# Patient Record
Sex: Female | Born: 1985 | Race: White | Hispanic: Yes | Marital: Married | Smoking: Never smoker
Health system: Southern US, Community
[De-identification: ages and names within clinical notes are randomized; demographics above are authoritative.]

## PROBLEM LIST (undated history)

## (undated) ENCOUNTER — Inpatient Hospital Stay (HOSPITAL_COMMUNITY): Payer: Self-pay

## (undated) DIAGNOSIS — I10 Essential (primary) hypertension: Secondary | ICD-10-CM

## (undated) DIAGNOSIS — G43829 Menstrual migraine, not intractable, without status migrainosus: Secondary | ICD-10-CM

## (undated) DIAGNOSIS — G4730 Sleep apnea, unspecified: Secondary | ICD-10-CM

## (undated) DIAGNOSIS — N912 Amenorrhea, unspecified: Secondary | ICD-10-CM

## (undated) DIAGNOSIS — L659 Nonscarring hair loss, unspecified: Secondary | ICD-10-CM

## (undated) DIAGNOSIS — E282 Polycystic ovarian syndrome: Secondary | ICD-10-CM

## (undated) HISTORY — DX: Essential (primary) hypertension: I10

## (undated) HISTORY — DX: Sleep apnea, unspecified: G47.30

## (undated) HISTORY — DX: Amenorrhea, unspecified: N91.2

## (undated) HISTORY — PX: WISDOM TOOTH EXTRACTION: SHX21

## (undated) HISTORY — DX: Nonscarring hair loss, unspecified: L65.9

## (undated) HISTORY — DX: Polycystic ovarian syndrome: E28.2

## (undated) HISTORY — DX: Menstrual migraine, not intractable, without status migrainosus: G43.829

---
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
## 2006-03-12 HISTORY — PX: FOOT SURGERY: SHX648

## 2011-06-26 ENCOUNTER — Other Ambulatory Visit: Payer: Self-pay

## 2011-06-26 MED ORDER — NORETHINDRONE ACET-ETHINYL EST 1.5-30 MG-MCG PO TABS: 1.0000 | ORAL_TABLET | Freq: Every day | ORAL | Status: DC

## 2011-07-03 ENCOUNTER — Telehealth: Payer: Self-pay

## 2011-07-03 ENCOUNTER — Other Ambulatory Visit: Payer: Self-pay

## 2011-07-03 NOTE — Telephone Encounter (Signed)
Lm with mom to [place] office rgd rx rf request from pharmacy

## 2011-07-03 NOTE — Telephone Encounter (Signed)
Spoke with pt rgd rx rf request from pharm pt states have enough packs until aex

## 2011-10-15 ENCOUNTER — Encounter: Payer: Self-pay | Admitting: Obstetrics and Gynecology

## 2011-10-15 ENCOUNTER — Ambulatory Visit (INDEPENDENT_AMBULATORY_CARE_PROVIDER_SITE_OTHER): Payer: 59 | Admitting: Obstetrics and Gynecology

## 2011-10-15 VITALS — BP 130/80 | HR 84 | Ht 64.0 in | Wt 251.0 lb

## 2011-10-15 DIAGNOSIS — Z124 Encounter for screening for malignant neoplasm of cervix: Secondary | ICD-10-CM

## 2011-10-15 MED ORDER — NORGESTREL-ETHINYL ESTRADIOL 0.3-30 MG-MCG PO TABS: 1.0000 | ORAL_TABLET | Freq: Every day | ORAL | Status: DC

## 2011-10-15 NOTE — Progress Notes (Signed)
Last Pap: 10/23/10 WNL: Yes Regular Periods:yes Contraception: Pill   Monthly Breast exam:no Tetanus<60yrs:yes Nl.Bladder Function:yes Daily BMs:yes Healthy Diet:yes Calcium:no Mammogram:no Date of Mammogram: n/a Exercise:yes Have often Exercise: sometimes Seatbelt: yes Abuse at home: no Stressful work:yes Sigmoid-colonoscopy: n/a Bone Density: No PCP: Dr. Johny Blamer Change in PMH: none Change in RUE:AVWU Pt without complaints Physical Examination: General appearance - alert, well appearing, and in no distress Mental status - normal mood, behavior, speech, dress, motor activity, and thought processes Neck - supple, no significant adenopathy, thyroid exam: thyroid is normal in size without nodules or tenderness Chest - clear to auscultation, no wheezes, rales or rhonchi, symmetric air entry Heart - normal rate and regular rhythm Abdomen - soft, nontender, nondistended, no masses or organomegaly Breasts - breasts appear normal, no suspicious masses, no skin or nipple changes or axillary nodes Pelvic - normal external genitalia, vulva, vagina, cervix, uterus and adnexa Rectal - normal rectal, no masses, rectal exam not indicated Back exam - full range of motion, no tenderness, palpable spasm or pain on motion Neurological - alert, oriented, normal speech, no focal findings or movement disorder noted Musculoskeletal - no joint tenderness, deformity or swelling Extremities - no edema, redness or tenderness in the calves or thighs Skin - normal coloration and turgor, no rashes, no suspicious skin lesions noted Routine exam Pap sent yes next due 2015 Mammogram due no lo ovral rx given RT 39yr

## 2011-10-15 NOTE — Patient Instructions (Signed)
Exercise to Stay Healthy  Exercise helps you become and stay healthy.    EXERCISE IDEAS AND TIPS  Choose exercises that:   You enjoy.   Fit into your day.  You do not need to exercise really hard to be healthy. You can do exercises at a slow or medium level and stay healthy. You can:   Stretch before and after working out.   Try yoga, Pilates, or tai chi.   Lift weights.   Walk fast, swim, jog, run, climb stairs, bicycle, dance, or rollerskate.   Take aerobic classes.    Exercises that burn about 150 calories:     Running 1  miles in 15 minutes.   Playing volleyball for 45 to 60 minutes.   Washing and waxing a car for 45 to 60 minutes.   Playing touch football for 45 minutes.   Walking 1  miles in 35 minutes.   Pushing a stroller 1  miles in 30 minutes.   Playing basketball for 30 minutes.   Raking leaves for 30 minutes.   Bicycling 5 miles in 30 minutes.   Walking 2 miles in 30 minutes.   Dancing for 30 minutes.   Shoveling snow for 15 minutes.   Swimming laps for 20 minutes.   Walking up stairs for 15 minutes.   Bicycling 4 miles in 15 minutes.   Gardening for 30 to 45 minutes.   Jumping rope for 15 minutes.   Washing windows or floors for 45 to 60 minutes.  Document Released: 03/31/2010 Document Revised: 02/15/2011 Document Reviewed: 03/31/2010  [employer] Patient Information 2012 [employer].

## 2011-10-16 LAB — PAP IG W/ RFLX HPV ASCU

## 2015-06-23 ENCOUNTER — Other Ambulatory Visit: Payer: Self-pay | Admitting: Obstetrics and Gynecology

## 2015-06-23 DIAGNOSIS — R922 Inconclusive mammogram: Secondary | ICD-10-CM

## 2015-06-27 ENCOUNTER — Ambulatory Visit
Admission: RE | Admit: 2015-06-27 | Discharge: 2015-06-27 | Disposition: A | Discharge status: Home / Self Care | Payer: PRIVATE HEALTH INSURANCE | Source: Ambulatory Visit | Attending: Obstetrics and Gynecology | Admitting: Obstetrics and Gynecology

## 2015-06-27 DIAGNOSIS — R922 Inconclusive mammogram: Secondary | ICD-10-CM

## 2019-01-09 DIAGNOSIS — F419 Anxiety disorder, unspecified: Secondary | ICD-10-CM | POA: Insufficient documentation

## 2019-11-01 DIAGNOSIS — G4733 Obstructive sleep apnea (adult) (pediatric): Secondary | ICD-10-CM | POA: Insufficient documentation

## 2019-11-26 DIAGNOSIS — K7581 Nonalcoholic steatohepatitis (NASH): Secondary | ICD-10-CM | POA: Insufficient documentation

## 2019-11-27 DIAGNOSIS — E7841 Elevated Lipoprotein(a): Secondary | ICD-10-CM | POA: Insufficient documentation

## 2020-02-01 LAB — OB RESULTS CONSOLE ABO/RH: RH Type: POSITIVE

## 2020-03-12 NOTE — L&D Delivery Note (Addendum)
Delivery Note:   G1P0 at [redacted]w[redacted]d  Admitting diagnosis: Preterm labor Risks:  1. Mo/Di twins, concordant growth, AGA, no TTTS 2. Cervical incompetence since 23 wks, on vaginal prometrium, BMZ course given at 24 wks and rescue dose at 34 wks 3. GDM A1 well controlled 4. BMI 48 with adequate TWG 5. Hx PCOS 6. Hx infertility 7. GBS positive 8. Labile BP  Onset of labor: 4/19 @ 10 pm IOL/Augmentation: AROM and Pitocin ROM: Twin A 0955 4/20 clear fluid  Complete dilation at 1108 Onset of pushing at 1110 for 1.5 hrs, patient unable to tolerate pelvic discomfort with pushing, epidural and passive descent recommended and agreed. Pitocin started after epidural for augmentation.   FHR second stage Twin A and B cat I  Analgesia /Anesthesia intrapartum: epidural Pushing in H/K then recumbent with R and L tilt position with SNM, CNM and L&D staff support, mother [name] and FOB [name] present for birth and supportive. Pushing restarted at 1530 after passive descent and frequent position changes, patient able to push more effectively with epidural relief.  Delivery of a    [place], [place] [[id number]]  Live born female  Birth Weight:  pending APGAR: 7, 8  Newborn Delivery   Birth date/time: 06/29/2019 17:02:00 Delivery type: Vaginal, Spontaneous    [facility]born delivered [facility],  OA to ROT, easy shoulders after CAN x 1 reduced. Baby with stunned appearance, briefly shown to mother than cord clamped and cut and baby passed to NICU team in room.   Dr. Ernestina Penna present for birth and assisted with BS sono for presentation, Twin B vertex with fetal hand presenting compound. Encouraged hand to retract by gently pinching and momentarily resolved, AROM with next contraction, then loop of cord presented with large gush of fluid.  Patient encouraged to push and brought vertex to +3 station with several pushed then vacuum applied by Dr. Ernestina Penna and expedited delivery of Twin B. Baby  vigorous and strong cry with stimulation. To mother's chest shortly then to NICU team for assessment. Cord cut by Dr. Ernestina Penna after delivery.    [address] [place], [place] [[id number]]  Live born female  Birth Weight:  pending APGAR: 8, 9  Newborn Delivery   Birth date/time: 06/[birth year]/2020 17:07:00 Delivery type: Vaginal, Vacuum (Extractor)    Care resumed by [facility] and attending CNM. Collection of cord blood for typing completed. Cord blood donation-no   Placenta delivered- S/C/I . Of note, Twin A marginal cord insertion, twin B velamentous insertion and true knot in cord.  Uterotonics: Pitocin IV bolus Placenta to L&D for disposal. Uterine tone firm, bleeding small  First degree perineal lac noted, small bleed, approximated with 2 running suture, good hemostasis.  Local analgesia: NA  Repair: 3.0 Vicryl Est. Blood Loss (mL): 75 cc   Complications: None   Mom to postpartum.  Babies [name] and [place] to NICU.  Delivery Report:  Review the Delivery Report for details.     Signed: Neta Mends, CNM, MSN 06/29/2020, 5:51 PM  Delivery as described above.  I was present for the entire delivery.  After delivery of baby A performed a bedside ultrasound to confirm position of baby B.  Vertex confirmed and after resolution of nuchal hand AROM done by Arlan Organ.  At this time cord prolapse of baby B was noted.  And mother encouraged to push.  Patient usually pushed baby to +3 station.  Given good pushing effort and small baby decision made to deliver through cord prolapse  with assist of a Kiwi vacuum.  Gestational age appreciated.  Risk benefits favored expedited delivery.  NICU in attendance.  Vacuum was placed on the fetal occiput but rapid pop-off due to excessive hair and poor suction.  Second attempt at the vacuum achieved good suction and with gentle downward pressure over about 20 seconds baby vertex delivered easily.  Vacuum removed remainder of baby delivered without delay or  complication.  Lendon Colonel 06/30/2020 11:13 AM

## 2020-03-17 ENCOUNTER — Telehealth: Payer: Self-pay | Admitting: Obstetrics and Gynecology

## 2020-03-17 NOTE — Telephone Encounter (Signed)
I TALK WITH PATIENT SHE IS AWARE OF APPT AND VISITOR POLICY

## 2020-03-29 ENCOUNTER — Encounter: Payer: Self-pay | Admitting: *Deleted

## 2020-03-30 ENCOUNTER — Other Ambulatory Visit: Payer: Self-pay | Admitting: *Deleted

## 2020-03-30 ENCOUNTER — Other Ambulatory Visit: Payer: Self-pay

## 2020-03-30 ENCOUNTER — Encounter: Payer: Self-pay | Admitting: *Deleted

## 2020-03-30 ENCOUNTER — Ambulatory Visit: Payer: No Typology Code available for payment source | Admitting: *Deleted

## 2020-03-30 ENCOUNTER — Other Ambulatory Visit: Payer: Self-pay | Admitting: Obstetrics and Gynecology

## 2020-03-30 ENCOUNTER — Ambulatory Visit: Payer: No Typology Code available for payment source | Attending: Obstetrics and Gynecology

## 2020-03-30 VITALS — BP 123/72 | HR 95

## 2020-03-30 DIAGNOSIS — Z363 Encounter for antenatal screening for malformations: Secondary | ICD-10-CM

## 2020-03-30 DIAGNOSIS — O30002 Twin pregnancy, unspecified number of placenta and unspecified number of amniotic sacs, second trimester: Secondary | ICD-10-CM

## 2020-03-30 DIAGNOSIS — E669 Obesity, unspecified: Secondary | ICD-10-CM | POA: Diagnosis not present

## 2020-03-30 DIAGNOSIS — O10912 Unspecified pre-existing hypertension complicating pregnancy, second trimester: Secondary | ICD-10-CM | POA: Diagnosis not present

## 2020-03-30 DIAGNOSIS — O30032 Twin pregnancy, monochorionic/diamniotic, second trimester: Secondary | ICD-10-CM | POA: Diagnosis not present

## 2020-03-30 DIAGNOSIS — O10019 Pre-existing essential hypertension complicating pregnancy, unspecified trimester: Secondary | ICD-10-CM | POA: Insufficient documentation

## 2020-03-30 DIAGNOSIS — Z3A21 21 weeks gestation of pregnancy: Secondary | ICD-10-CM

## 2020-03-30 DIAGNOSIS — Z8489 Family history of other specified conditions: Secondary | ICD-10-CM

## 2020-03-30 DIAGNOSIS — O321XX2 Maternal care for breech presentation, fetus 2: Secondary | ICD-10-CM

## 2020-03-30 DIAGNOSIS — O30039 Twin pregnancy, monochorionic/diamniotic, unspecified trimester: Secondary | ICD-10-CM

## 2020-03-30 DIAGNOSIS — O99212 Obesity complicating pregnancy, second trimester: Secondary | ICD-10-CM

## 2020-03-30 DIAGNOSIS — Z3686 Encounter for antenatal screening for cervical length: Secondary | ICD-10-CM

## 2020-04-11 ENCOUNTER — Inpatient Hospital Stay (HOSPITAL_COMMUNITY)
Admission: AD | Admit: 2020-04-11 | Discharge: 2020-04-12 | Disposition: A | Discharge status: Home / Self Care | Payer: No Typology Code available for payment source | Attending: Obstetrics & Gynecology | Admitting: Obstetrics & Gynecology

## 2020-04-11 ENCOUNTER — Other Ambulatory Visit: Payer: Self-pay

## 2020-04-11 DIAGNOSIS — R102 Pelvic and perineal pain unspecified side: Secondary | ICD-10-CM

## 2020-04-11 DIAGNOSIS — R012 Other cardiac sounds: Secondary | ICD-10-CM | POA: Insufficient documentation

## 2020-04-11 DIAGNOSIS — O26892 Other specified pregnancy related conditions, second trimester: Secondary | ICD-10-CM | POA: Insufficient documentation

## 2020-04-11 DIAGNOSIS — Z3492 Encounter for supervision of normal pregnancy, unspecified, second trimester: Secondary | ICD-10-CM

## 2020-04-11 DIAGNOSIS — Z3A23 23 weeks gestation of pregnancy: Secondary | ICD-10-CM | POA: Insufficient documentation

## 2020-04-11 DIAGNOSIS — O26899 Other specified pregnancy related conditions, unspecified trimester: Secondary | ICD-10-CM

## 2020-04-12 ENCOUNTER — Encounter (HOSPITAL_COMMUNITY): Payer: Self-pay | Admitting: Obstetrics & Gynecology

## 2020-04-12 DIAGNOSIS — Z3A23 23 weeks gestation of pregnancy: Secondary | ICD-10-CM

## 2020-04-12 DIAGNOSIS — R102 Pelvic and perineal pain: Secondary | ICD-10-CM

## 2020-04-12 DIAGNOSIS — R012 Other cardiac sounds: Secondary | ICD-10-CM | POA: Diagnosis present

## 2020-04-12 DIAGNOSIS — O26892 Other specified pregnancy related conditions, second trimester: Secondary | ICD-10-CM | POA: Diagnosis not present

## 2020-04-12 NOTE — Discharge Instructions (Signed)
Preterm Labor The normal length of a pregnancy is 39-41 weeks. Preterm labor is when labor starts before 37 completed weeks of pregnancy. Babies who are born prematurely and survive may not be fully developed and may be at an increased risk for long-term problems such as cerebral palsy, developmental delays, and vision and hearing problems. Babies who are born too early may have problems soon after birth. Problems may include regulating blood sugar, body temperature, heart rate, and breathing rate. These babies often have trouble with feeding. The risk of having problems is highest for babies who are born before 34 weeks of pregnancy. What are the causes? The exact cause of this condition is not known. What increases the risk? You are more likely to have preterm labor if you have certain risk factors that relate to your medical history, problems with present and past pregnancies, and lifestyle factors. Medical history  You have abnormalities of the uterus, including a short cervix.  You have STIs (sexually transmitted infections), or other infections of the urinary tract and the vagina.  You have chronic illnesses, such as blood clotting problems, diabetes, or high blood pressure.  You are overweight or underweight. Present and past pregnancies  You have had preterm labor before.  You are pregnant with twins or other multiples.  You have been diagnosed with a condition in which the placenta covers your cervix (placenta previa).  You waited less than 6 months between giving birth and becoming pregnant again.  Your unborn baby has some abnormalities.  You have vaginal bleeding during pregnancy.  You became pregnant through in vitro fertilization (IVF). Lifestyle and environmental factors  You use tobacco products.  You drink alcohol.  You use street drugs.  You have stress and no social support.  You experience domestic violence.  You are exposed to certain chemicals or  environmental pollutants. Other factors  You are younger than age 17 or older than age 35. What are the signs or symptoms? Symptoms of this condition include:  Cramps similar to those that can happen during a menstrual period. The cramps may happen with diarrhea.  Pain in the abdomen or lower back.  Regular contractions that may feel like tightening of the abdomen.  A feeling of increased pressure in the pelvis.  Increased watery or bloody mucus discharge from the vagina.  Water breaking (ruptured amniotic sac). How is this diagnosed? This condition is diagnosed based on:  Your medical history and a physical exam.  A pelvic exam.  An ultrasound.  Monitoring your uterus for contractions.  Other tests, including: ? A swab of the cervix to check for a chemical called fetal fibronectin. ? Urine tests. How is this treated? Treatment for this condition depends on the length of your pregnancy, your condition, and the health of your baby. Treatment may include:  Taking medicines, such as: ? Hormone medicines. These may be given early in pregnancy to help support the pregnancy. ? Medicines to stop contractions. ? Medicines to help mature the baby's lungs. These may be prescribed if the risk of delivery is high. ? Medicines to prevent your baby from developing cerebral palsy.  Bed rest. If the labor happens before 34 weeks of pregnancy, you may need to stay in the hospital.  Delivery of the baby. Follow these instructions at home:  Do not use any products that contain nicotine or tobacco, such as cigarettes, e-cigarettes, and chewing tobacco. If you need help quitting, ask your health care provider.  Do not drink alcohol.    Take over-the-counter and prescription medicines only as told by your health care provider.  Rest as told by your health care provider.  Return to your normal activities as told by your health care provider. Ask your health care provider what activities  are safe for you.  Keep all follow-up visits as told by your health care provider. This is important.   How is this prevented? To increase your chance of having a full-term pregnancy:  Do not use street drugs or medicines that have not been prescribed to you during your pregnancy.  Talk with your health care provider before taking any herbal supplements, even if you have been taking them regularly.  Make sure you gain a healthy amount of weight during your pregnancy.  Watch for infection. If you think that you might have an infection, get it checked right away. Symptoms of infection may include: ? Fever. ? Abnormal vaginal discharge or discharge that smells bad. ? Pain or burning with urination. ? Needing to urinate urgently. ? Frequently urinating or passing small amounts of urine frequently. ? Blood in your urine. ? Urine that smells bad or unusual.  Tell your health care provider if you have had preterm labor before. Contact a health care provider if:  You think you are going into preterm labor.  You have signs or symptoms of preterm labor.  You have symptoms of infection. Get help right away if:  You are having regular, painful contractions every 5 minutes or less.  Your water breaks. Summary  Preterm labor is labor that starts before you reach 37 weeks of pregnancy.  Delivering your baby early increases your baby's risk of developing lifelong problems.  The exact cause of preterm labor is unknown. However, having an abnormal uterus, an STI (sexually transmitted infection), or vaginal bleeding during pregnancy increases your risk for preterm labor.  Keep all follow-up visits as told by your health care provider. This is important.  Contact a health care provider if you have signs or symptoms of preterm labor. This information is not intended to replace advice given to you by your health care provider. Make sure you discuss any questions you have with your health care  provider. Document Revised: 03/31/2019 Document Reviewed: 03/31/2019 Elsevier Patient Education  2021 [employer].  

## 2020-04-12 NOTE — MAU Provider Note (Addendum)
Event Date/Time   First Provider Initiated Contact with Patient 04/12/20 0238     S Ms. Ashley Durham is a 35 y.o. G1P0 pregnant female at [redacted]w[redacted]d who presents to MAU today with complaint of pelvic cramping since bedtime (2300). She receives care at [facility] (with Dr. Billy Coast) and is being followed for a shortened/tunneling cervix. Today her TVUS showed a CL=0.98 but closed on cervical exam. She was prescribed vaginal progesterone but did not place it this evening since the bottle said "take orally." Denies vaginal bleeding or contractions, endorses fetal movement.   O BP 128/81   Pulse 97   Temp 98.5 F (36.9 C) (Oral)   Resp 18   Ht 5\' 4"  (1.626 m)   Wt 288 lb 4.8 oz (130.8 kg)   LMP 10/10/2019   SpO2 99%   BMI 49.49 kg/m  Physical Exam Vitals and nursing note reviewed. Exam conducted with a chaperone present.  Constitutional:      General: She is not in acute distress.    Appearance: Normal appearance. She is not ill-appearing.  HENT:     Mouth/Throat:     Mouth: Mucous membranes are moist.  Eyes:     Pupils: Pupils are equal, round, and reactive to light.  Cardiovascular:     Rate and Rhythm: Normal rate.     Pulses: Normal pulses.  Pulmonary:     Effort: Pulmonary effort is normal.  Genitourinary:    General: Normal vulva.     Comments: Dilation: Closed Cervical Position: Middle Station: Ballotable Presentation: Undeterminable Exam by:: [email or web address], CNM   Musculoskeletal:        General: Normal range of motion.  Skin:    General: Skin is warm.     Capillary Refill: Capillary refill takes less than 2 seconds.  Neurological:     Mental Status: She is alert and oriented to person, place, and time.  Psychiatric:        Mood and Affect: Mood normal.        Behavior: Behavior normal.        Thought Content: Thought content normal.        Judgment: Judgment normal.   Babies difficult to keep on monitor due to gestational age and maternal habitus, but both  easily heard via Doppler Baby A FHR: 148 Baby B FHR: 135  Called Dr. Tyler Aas to consult regarding need for repeat TVUS for CL. Recommended repeat U/S but also recommended calling community OB to verify. Called Dr. Vergie Living who declined repeat TVUS in MAU given that cervical exam is unchanged from today. Pt has close follow up on Thursday and he will repeat if needed then and begin BMZ at that visit.  Discussed with pt and advised she can place her vaginal progesterone when she gets home as the oral and vaginal preparations are the same.   A Pelvic cramping Fetal heart tones present  P Discharge from MAU in stable condition with preterm labor precautions Follow up visit at [facility] on 04/14/20   06/12/20, CNM 04/12/2020 3:08 AM

## 2020-04-12 NOTE — MAU Note (Signed)
Patient reports to MAU after going to the doctors office today and finding out that cervix was 1cm long, not dilated, and was put on modified bedrest. Cramping started about 2300 on 04/11/20 that felt like period cramps. Called after hours line and spoke to midwife. Was recommended to come to MAU.  Denies vaginal bleeding.  +FM but possibly decreased tonight.

## 2020-04-14 ENCOUNTER — Encounter: Payer: Self-pay | Admitting: Pediatric Cardiology

## 2020-04-14 LAB — OB RESULTS CONSOLE GBS: GBS: POSITIVE

## 2020-05-05 ENCOUNTER — Other Ambulatory Visit: Payer: Self-pay

## 2020-05-05 ENCOUNTER — Ambulatory Visit: Payer: No Typology Code available for payment source | Admitting: *Deleted

## 2020-05-05 ENCOUNTER — Other Ambulatory Visit: Payer: Self-pay | Admitting: *Deleted

## 2020-05-05 ENCOUNTER — Ambulatory Visit: Payer: No Typology Code available for payment source | Attending: Obstetrics and Gynecology

## 2020-05-05 ENCOUNTER — Encounter: Payer: Self-pay | Admitting: *Deleted

## 2020-05-05 VITALS — BP 126/77 | HR 100

## 2020-05-05 DIAGNOSIS — Z3686 Encounter for antenatal screening for cervical length: Secondary | ICD-10-CM | POA: Diagnosis not present

## 2020-05-05 DIAGNOSIS — Z363 Encounter for antenatal screening for malformations: Secondary | ICD-10-CM

## 2020-05-05 DIAGNOSIS — O30032 Twin pregnancy, monochorionic/diamniotic, second trimester: Secondary | ICD-10-CM | POA: Diagnosis not present

## 2020-05-05 DIAGNOSIS — O10912 Unspecified pre-existing hypertension complicating pregnancy, second trimester: Secondary | ICD-10-CM | POA: Insufficient documentation

## 2020-05-05 DIAGNOSIS — Z3A27 27 weeks gestation of pregnancy: Secondary | ICD-10-CM

## 2020-05-05 DIAGNOSIS — O10012 Pre-existing essential hypertension complicating pregnancy, second trimester: Secondary | ICD-10-CM

## 2020-05-05 DIAGNOSIS — O30039 Twin pregnancy, monochorionic/diamniotic, unspecified trimester: Secondary | ICD-10-CM

## 2020-05-05 DIAGNOSIS — O99212 Obesity complicating pregnancy, second trimester: Secondary | ICD-10-CM | POA: Diagnosis not present

## 2020-05-05 DIAGNOSIS — E669 Obesity, unspecified: Secondary | ICD-10-CM

## 2020-05-05 DIAGNOSIS — Z8489 Family history of other specified conditions: Secondary | ICD-10-CM

## 2020-05-12 LAB — OB RESULTS CONSOLE RPR
RPR: NONREACTIVE
RPR: NONREACTIVE
RPR: NONREACTIVE

## 2020-05-19 ENCOUNTER — Other Ambulatory Visit: Payer: Self-pay

## 2020-05-19 ENCOUNTER — Encounter (HOSPITAL_COMMUNITY): Payer: Self-pay | Admitting: Obstetrics and Gynecology

## 2020-05-19 ENCOUNTER — Inpatient Hospital Stay (HOSPITAL_COMMUNITY)
Admission: AD | Admit: 2020-05-19 | Discharge: 2020-05-20 | DRG: 833 | Disposition: A | Discharge status: Home / Self Care | Payer: No Typology Code available for payment source | Attending: Obstetrics and Gynecology | Admitting: Obstetrics and Gynecology

## 2020-05-19 DIAGNOSIS — O30033 Twin pregnancy, monochorionic/diamniotic, third trimester: Secondary | ICD-10-CM | POA: Diagnosis present

## 2020-05-19 DIAGNOSIS — Z8751 Personal history of pre-term labor: Secondary | ICD-10-CM | POA: Diagnosis present

## 2020-05-19 DIAGNOSIS — O24410 Gestational diabetes mellitus in pregnancy, diet controlled: Secondary | ICD-10-CM | POA: Diagnosis present

## 2020-05-19 DIAGNOSIS — Z3A31 31 weeks gestation of pregnancy: Secondary | ICD-10-CM | POA: Diagnosis not present

## 2020-05-19 DIAGNOSIS — O99820 Streptococcus B carrier state complicating pregnancy: Secondary | ICD-10-CM | POA: Diagnosis present

## 2020-05-19 DIAGNOSIS — Z20822 Contact with and (suspected) exposure to covid-19: Secondary | ICD-10-CM | POA: Diagnosis present

## 2020-05-19 LAB — DIFFERENTIAL
Abs Immature Granulocytes: 0.12 10*3/uL — ABNORMAL HIGH (ref 0.00–0.07)
Basophils Absolute: 0 10*3/uL (ref 0.0–0.1)
Basophils Relative: 0 %
Eosinophils Absolute: 0.1 10*3/uL (ref 0.0–0.5)
Eosinophils Relative: 1 %
Immature Granulocytes: 1 %
Lymphocytes Relative: 19 %
Lymphs Abs: 2 10*3/uL (ref 0.7–4.0)
Monocytes Absolute: 0.6 10*3/uL (ref 0.1–1.0)
Monocytes Relative: 6 %
Neutro Abs: 7.7 10*3/uL (ref 1.7–7.7)
Neutrophils Relative %: 73 %

## 2020-05-19 LAB — CBC
HCT: 34.7 % — ABNORMAL LOW (ref 36.0–46.0)
Hemoglobin: 11.3 g/dL — ABNORMAL LOW (ref 12.0–15.0)
MCH: 24.8 pg — ABNORMAL LOW (ref 26.0–34.0)
MCHC: 32.6 g/dL (ref 30.0–36.0)
MCV: 76.3 fL — ABNORMAL LOW (ref 80.0–100.0)
Platelets: 248 10*3/uL (ref 150–400)
RBC: 4.55 MIL/uL (ref 3.87–5.11)
RDW: 14.3 % (ref 11.5–15.5)
WBC: 10.6 10*3/uL — ABNORMAL HIGH (ref 4.0–10.5)
nRBC: 0 % (ref 0.0–0.2)

## 2020-05-19 LAB — COMPREHENSIVE METABOLIC PANEL
ALT: 27 U/L (ref 0–44)
AST: 17 U/L (ref 15–41)
Albumin: 2.7 g/dL — ABNORMAL LOW (ref 3.5–5.0)
Alkaline Phosphatase: 181 U/L — ABNORMAL HIGH (ref 38–126)
Anion gap: 9 (ref 5–15)
BUN: 7 mg/dL (ref 6–20)
CO2: 23 mmol/L (ref 22–32)
Calcium: 9.1 mg/dL (ref 8.9–10.3)
Chloride: 104 mmol/L (ref 98–111)
Creatinine, Ser: 0.54 mg/dL (ref 0.44–1.00)
GFR, Estimated: >60 mL/min (ref >60)
Glucose, Bld: 81 mg/dL (ref 70–99)
Potassium: 3.3 mmol/L — ABNORMAL LOW (ref 3.5–5.1)
Sodium: 136 mmol/L (ref 135–145)
Total Bilirubin: 0.8 mg/dL (ref 0.3–1.2)
Total Protein: 6.3 g/dL — ABNORMAL LOW (ref 6.5–8.1)

## 2020-05-19 LAB — SARS CORONAVIRUS 2 (TAT 6-24 HRS): SARS Coronavirus 2: NEGATIVE

## 2020-05-19 LAB — GLUCOSE, CAPILLARY
Glucose-Capillary: 90 mg/dL (ref 70–99)
Glucose-Capillary: 95 mg/dL (ref 70–99)

## 2020-05-19 LAB — HEMOGLOBIN A1C
Hgb A1c MFr Bld: 5.6 % (ref 4.8–5.6)
Mean Plasma Glucose: 114.02 mg/dL

## 2020-05-19 LAB — TYPE AND SCREEN
ABO/RH(D): O POS
Antibody Screen: NEGATIVE

## 2020-05-19 MED ORDER — SODIUM CHLORIDE 0.9% FLUSH
3.0000 mL | Freq: Two times a day (BID) | INTRAVENOUS | Status: DC
  Administered 2020-05-20: 3 mL via INTRAVENOUS

## 2020-05-19 MED ORDER — PRENATAL MULTIVITAMIN CH: 1.0000 | ORAL_TABLET | Freq: Every day | ORAL | Status: DC

## 2020-05-19 MED ORDER — PROGESTERONE 200 MG PO CAPS
200.0000 mg | ORAL_CAPSULE | Freq: Every day | ORAL | Status: DC
  Administered 2020-05-19: 200 mg via VAGINAL
  Filled 2020-05-19 (×3): qty 1

## 2020-05-19 MED ORDER — ACETAMINOPHEN 325 MG PO TABS: 650.0000 mg | ORAL_TABLET | ORAL | Status: DC | PRN

## 2020-05-19 MED ORDER — AZITHROMYCIN 250 MG PO TABS
1000.0000 mg | ORAL_TABLET | Freq: Once | ORAL | Status: AC
  Administered 2020-05-19: 1000 mg via ORAL
  Filled 2020-05-19: qty 4

## 2020-05-19 MED ORDER — CALCIUM CARBONATE ANTACID 500 MG PO CHEW: 2.0000 | CHEWABLE_TABLET | ORAL | Status: DC | PRN

## 2020-05-19 MED ORDER — SODIUM CHLORIDE 0.9% FLUSH: 3.0000 mL | INTRAVENOUS | Status: DC | PRN

## 2020-05-19 MED ORDER — DOCUSATE SODIUM 100 MG PO CAPS: 100.0000 mg | ORAL_CAPSULE | Freq: Every day | ORAL | Status: DC

## 2020-05-19 MED ORDER — AMOXICILLIN 500 MG PO CAPS: 500.0000 mg | ORAL_CAPSULE | Freq: Three times a day (TID) | ORAL | Status: DC

## 2020-05-19 MED ORDER — ZOLPIDEM TARTRATE 5 MG PO TABS
5.0000 mg | ORAL_TABLET | Freq: Every evening | ORAL | Status: DC | PRN
Start: 2020-05-19 — End: 2020-05-20

## 2020-05-19 MED ORDER — SODIUM CHLORIDE 0.9 % IV SOLN
2.0000 g | Freq: Four times a day (QID) | INTRAVENOUS | Status: DC
  Administered 2020-05-19 – 2020-05-20 (×4): 2 g via INTRAVENOUS
  Filled 2020-05-19 (×4): qty 2000

## 2020-05-19 MED ORDER — SODIUM CHLORIDE 0.9 % IV SOLN: 250.0000 mL | INTRAVENOUS | Status: DC | PRN

## 2020-05-19 NOTE — Plan of Care (Signed)
  Problem: Education: Goal: Knowledge of disease or condition will improve Outcome: Progressing Goal: Knowledge of the prescribed therapeutic regimen will improve Outcome: Progressing   Problem: Pain Managment: Goal: General experience of comfort will improve Outcome: Progressing

## 2020-05-19 NOTE — H&P (Signed)
Ashley Durham is a 35 y.o. female presenting for preterm cervical change.Mono Di twins with concordant growth and no evidence of TTTS. Growth with MFM 2/24. History of cervical shortening this pregnancy on prometrium. Change in VE today. BMZ complete. Admit to rule out PTL. OB History    Gravida  1   Para      Term      Preterm      AB      Living  0     SAB      IAB      Ectopic      Multiple      Live Births             Past Medical History:  Diagnosis Date  . Alopecia   . Amenorrhea   . Hypertension   . Menstrual headache   . PCOS (polycystic ovarian syndrome)   . Sleep apnea    Past Surgical History:  Procedure Laterality Date  . FOOT SURGERY  2008   extra bone removed from foot  . WISDOM TOOTH EXTRACTION     Family History: family history includes Arthritis in her mother; Cancer in her father, maternal grandfather, mother, and paternal grandfather; Diabetes in her paternal grandmother; Early death in her father; Heart disease in her paternal grandmother; Miscarriages / Stillbirths in her mother; Obesity in her father, mother, paternal grandfather, and paternal grandmother; Stroke in her maternal grandfather. Social History:  reports that she has never smoked. She has never used smokeless tobacco. She reports previous alcohol use. She reports that she does not use drugs.     Maternal Diabetes: Yes:  Diabetes Type:  Diet controlled Genetic Screening: Normal Maternal Ultrasounds/Referrals: Normal Fetal Ultrasounds or other Referrals:  Referred to Materal Fetal Medicine  Maternal Substance Abuse:  No Significant Maternal Medications:  None Significant Maternal Lab Results:  Group B Strep positive Other Comments:  None  Review of Systems  Constitutional: Negative.   All other systems reviewed and are negative.  Maternal Medical History:  Fetal activity: Perceived fetal activity is normal.   Last perceived fetal movement was within the past hour.     Prenatal complications: Preterm labor.   Prenatal Complications - Diabetes: gestational.      Blood pressure (!) 147/87, pulse 93, resp. rate 16, height 5\' 4"  (1.626 m), weight 129.7 kg, last menstrual period 10/10/2019. Maternal Exam:  Uterine Assessment: Contraction strength is mild.  Contraction frequency is rare.   Abdomen: Patient reports no abdominal tenderness. Fetal presentation: vertex  Introitus: Normal vulva. Normal vagina.  Ferning test: not done.  Nitrazine test: not done.  Pelvis: adequate for delivery.   Cervix: Cervix evaluated by digital exam.     Physical Exam Constitutional:      Appearance: Normal appearance. She is obese.  HENT:     Head: Normocephalic and atraumatic.  Cardiovascular:     Rate and Rhythm: Normal rate and regular rhythm.     Pulses: Normal pulses.     Heart sounds: Normal heart sounds.  Pulmonary:     Effort: Pulmonary effort is normal.     Breath sounds: Normal breath sounds.  Abdominal:     General: Abdomen is flat. Bowel sounds are normal.     Palpations: Abdomen is soft.  Genitourinary:    General: Normal vulva.  Musculoskeletal:        General: Normal range of motion.     Cervical back: Normal range of motion and neck supple.  Skin:  General: Skin is warm and dry.  Neurological:     General: No focal deficit present.     Mental Status: She is alert and oriented to person, place, and time.  Psychiatric:        Mood and Affect: Mood normal.        Behavior: Behavior normal.     Prenatal labs: ABO, Rh:  pos Antibody:  neg Rubella:  imm RPR:   neg HBsAg:   meg HIV:   neg GBS:   pos  Assessment/Plan: 29 wk Mono C Di A twin IUP Preterm cervical change. New onset GDM GBS POS- will do IV abx for exposed membranes EFM/TOCO Prometrium If PTL noted, Mag neuroprotection Had BMZ 24wks.     Nisaiah Bechtol J 05/19/2020, 1:09 PM

## 2020-05-19 NOTE — Plan of Care (Signed)
  Problem: Education: Goal: Knowledge of General Education information will improve Description: Including pain rating scale, medication(s)/side effects and non-pharmacologic comfort measures Outcome: Completed/Met

## 2020-05-20 LAB — GLUCOSE, CAPILLARY
Glucose-Capillary: 76 mg/dL (ref 70–99)
Glucose-Capillary: 103 mg/dL — ABNORMAL HIGH (ref 70–99)

## 2020-05-20 MED ORDER — CEPHALEXIN 500 MG PO CAPS: 500.0000 mg | ORAL_CAPSULE | Freq: Two times a day (BID) | ORAL | 0 refills | Status: AC

## 2020-05-20 NOTE — Plan of Care (Signed)
  Problem: Education: Goal: Knowledge of disease or condition will improve Outcome: Adequate for Discharge Goal: Knowledge of the prescribed therapeutic regimen will improve Outcome: Adequate for Discharge Goal: Individualized Educational Video(s) Outcome: Adequate for Discharge   Problem: Clinical Measurements: Goal: Complications related to the disease process, condition or treatment will be avoided or minimized Outcome: Adequate for Discharge   Problem: Health Behavior/Discharge Planning: Goal: Ability to manage health-related needs will improve Outcome: Adequate for Discharge   Problem: Clinical Measurements: Goal: Ability to maintain clinical measurements within normal limits will improve Outcome: Adequate for Discharge Goal: Will remain free from infection Outcome: Adequate for Discharge Goal: Diagnostic test results will improve Outcome: Adequate for Discharge Goal: Respiratory complications will improve Outcome: Adequate for Discharge Goal: Cardiovascular complication will be avoided Outcome: Adequate for Discharge   Problem: Activity: Goal: Risk for activity intolerance will decrease Outcome: Adequate for Discharge   Problem: Nutrition: Goal: Adequate nutrition will be maintained Outcome: Adequate for Discharge   Problem: Coping: Goal: Level of anxiety will decrease Outcome: Adequate for Discharge   Problem: Elimination: Goal: Will not experience complications related to bowel motility Outcome: Adequate for Discharge Goal: Will not experience complications related to urinary retention Outcome: Adequate for Discharge   Problem: Pain Managment: Goal: General experience of comfort will improve Outcome: Adequate for Discharge   Problem: Safety: Goal: Ability to remain free from injury will improve Outcome: Adequate for Discharge   Problem: Skin Integrity: Goal: Risk for impaired skin integrity will decrease Outcome: Adequate for Discharge   

## 2020-05-20 NOTE — Progress Notes (Signed)
Patient ID: Ashley Durham, female   DOB: Jun 16, 1985, 35 y.o.   MRN: [id number] HD 1 No complaints No bleeding or LOF. Good FM No change in dc. BP 140/74 (BP Location: Left Arm)    Pulse 92    Temp 98.2 F (36.8 C) (Oral)    Resp 18    Ht 5\' 4"  (1.626 m)    Wt 129.7 kg    LMP 10/10/2019    SpO2 100%    BMI 49.09 kg/m    NCAT Lungs: CTA CV: RRR Abd: gravid , NT VE : deferred  FHR x reassuring Toco: Occ UI and no contractions over 18hr  IMP Preterm cervical change- no evidence PTL GDM- BS stable. GBS pos DC home PEC precautions and PTL precautions.

## 2020-05-21 LAB — URINE CULTURE: Culture: NO GROWTH

## 2020-06-03 ENCOUNTER — Ambulatory Visit: Payer: No Typology Code available for payment source | Attending: Obstetrics and Gynecology

## 2020-06-03 ENCOUNTER — Encounter: Payer: Self-pay | Admitting: *Deleted

## 2020-06-03 ENCOUNTER — Other Ambulatory Visit: Payer: Self-pay

## 2020-06-03 ENCOUNTER — Ambulatory Visit: Payer: No Typology Code available for payment source | Admitting: *Deleted

## 2020-06-03 VITALS — BP 128/62 | HR 93

## 2020-06-03 DIAGNOSIS — O10013 Pre-existing essential hypertension complicating pregnancy, third trimester: Secondary | ICD-10-CM

## 2020-06-03 DIAGNOSIS — O10913 Unspecified pre-existing hypertension complicating pregnancy, third trimester: Secondary | ICD-10-CM | POA: Insufficient documentation

## 2020-06-03 DIAGNOSIS — Z3A31 31 weeks gestation of pregnancy: Secondary | ICD-10-CM

## 2020-06-03 DIAGNOSIS — Z8489 Family history of other specified conditions: Secondary | ICD-10-CM

## 2020-06-03 DIAGNOSIS — O30039 Twin pregnancy, monochorionic/diamniotic, unspecified trimester: Secondary | ICD-10-CM | POA: Insufficient documentation

## 2020-06-03 DIAGNOSIS — O99213 Obesity complicating pregnancy, third trimester: Secondary | ICD-10-CM

## 2020-06-03 DIAGNOSIS — Z363 Encounter for antenatal screening for malformations: Secondary | ICD-10-CM

## 2020-06-03 DIAGNOSIS — E669 Obesity, unspecified: Secondary | ICD-10-CM

## 2020-06-03 DIAGNOSIS — O30033 Twin pregnancy, monochorionic/diamniotic, third trimester: Secondary | ICD-10-CM | POA: Diagnosis not present

## 2020-06-06 ENCOUNTER — Other Ambulatory Visit: Payer: Self-pay | Admitting: *Deleted

## 2020-06-06 DIAGNOSIS — O30033 Twin pregnancy, monochorionic/diamniotic, third trimester: Secondary | ICD-10-CM

## 2020-06-06 NOTE — Discharge Summary (Signed)
OB Discharge Summary  Patient Name: Ashley Durham DOB: May 22, 1985 MRN: [id number]  Date of admission: 05/19/2020 Delivering provider: This patient has no babies on file.  Admitting diagnosis: Preterm labor [O60.00] Intrauterine pregnancy: [redacted]w[redacted]d     Secondary diagnosis: Patient Active Problem List   Diagnosis Date Noted  . Preterm labor 05/19/2020   Additional problems:na   Date of discharge: 06/06/2020   Discharge diagnosis: Active Problems:   Preterm labor                                                              Post partum procedures:na  Augmentation: N/A Pain control: This patient has no babies on file. Laceration:This patient has no babies on file. Episiotomy:This patient has no babies on file. Complications: None  Hospital course:  na  Physical exam  Vitals:   05/19/20 1933 05/19/20 2333 05/20/20 0418 05/20/20 0756  BP: (!) 141/66 101/68 134/65 140/74  Pulse: 97 87 90 92  Resp: 16 16 16 18   Temp: 98.2 F (36.8 C) 98.2 F (36.8 C) 98.3 F (36.8 C) 98.2 F (36.8 C)  TempSrc: Oral Oral Oral Oral  SpO2: 97% 100% 100% 100%  Weight:      Height:       General: alert Lochia: appropriate Uterine Fundus: firm Incision: N/A Perineum: repair na, na edema DVT Evaluation: No significant calf/ankle edema. Labs: Lab Results  Component Value Date   WBC 10.6 (H) 05/19/2020   HGB 11.3 (L) 05/19/2020   HCT 34.7 (L) 05/19/2020   MCV 76.3 (L) 05/19/2020   PLT 248 05/19/2020   CMP Latest Ref Rng & Units 05/19/2020  Glucose 70 - 99 mg/dL 81  BUN 6 - 20 mg/dL 7  Creatinine 07/19/2020 - 2.59 mg/dL 5.63  Sodium 8.75 - 643 mmol/L 136  Potassium 3.5 - 5.1 mmol/L 3.3(L)  Chloride 98 - 111 mmol/L 104  CO2 22 - 32 mmol/L 23  Calcium 8.9 - 10.3 mg/dL 9.1  Total Protein 6.5 - 8.1 g/dL 6.3(L)  Total Bilirubin 0.3 - 1.2 mg/dL 0.8  Alkaline Phos 38 - 126 U/L 181(H)  AST 15 - 41 U/L 17  ALT 0 - 44 U/L 27   No flowsheet data found. Vaccines: TDaP          na         Flu              na                    COVID-19 na  Discharge instruction:  per After Visit Summary,  [facility] booklet and  "[facility]" hospital booklet  After Visit Meds:  Allergies as of 05/20/2020   No Known Allergies     Medication List    TAKE these medications   acetaminophen 500 MG tablet Commonly known as: TYLENOL Take 1,000 mg by mouth every 6 (six) hours as needed.   calcium carbonate 500 MG chewable tablet Commonly known as: TUMS - dosed in mg elemental calcium Chew 1-2 tablets by mouth daily as needed for indigestion or heartburn.   cholecalciferol 25 MCG (1000 UNIT) tablet Commonly known as: VITAMIN D3 Take 1,000 Units by mouth daily.   prenatal multivitamin Tabs tablet Take 1 tablet by mouth  daily at 12 noon.   vitamin C 1000 MG tablet Take 1,000 mg by mouth daily.     ASK your doctor about these medications   cephALEXin 500 MG capsule Commonly known as: Keflex Take 1 capsule (500 mg total) by mouth 2 (two) times daily for 7 days. Ask about: Should I take this medication?       Diet: routine diet  Activity: Advance as tolerated. Pelvic rest for 6 weeks.   Postpartum contraception: None         Signed: Milbert Coulter, MSN 06/06/2020, 6:38 AM

## 2020-06-10 ENCOUNTER — Ambulatory Visit: Payer: No Typology Code available for payment source | Attending: Obstetrics and Gynecology

## 2020-06-10 ENCOUNTER — Ambulatory Visit: Payer: No Typology Code available for payment source | Admitting: *Deleted

## 2020-06-10 ENCOUNTER — Encounter: Payer: Self-pay | Admitting: *Deleted

## 2020-06-10 ENCOUNTER — Other Ambulatory Visit: Payer: Self-pay

## 2020-06-10 VITALS — BP 118/75 | HR 96

## 2020-06-10 DIAGNOSIS — O10913 Unspecified pre-existing hypertension complicating pregnancy, third trimester: Secondary | ICD-10-CM

## 2020-06-10 DIAGNOSIS — O10013 Pre-existing essential hypertension complicating pregnancy, third trimester: Secondary | ICD-10-CM | POA: Diagnosis not present

## 2020-06-10 DIAGNOSIS — O30033 Twin pregnancy, monochorionic/diamniotic, third trimester: Secondary | ICD-10-CM | POA: Diagnosis not present

## 2020-06-10 DIAGNOSIS — O30039 Twin pregnancy, monochorionic/diamniotic, unspecified trimester: Secondary | ICD-10-CM | POA: Insufficient documentation

## 2020-06-10 DIAGNOSIS — Z8481 Family history of carrier of genetic disease: Secondary | ICD-10-CM

## 2020-06-10 DIAGNOSIS — O99213 Obesity complicating pregnancy, third trimester: Secondary | ICD-10-CM

## 2020-06-10 DIAGNOSIS — E669 Obesity, unspecified: Secondary | ICD-10-CM | POA: Diagnosis not present

## 2020-06-10 DIAGNOSIS — Z3A32 32 weeks gestation of pregnancy: Secondary | ICD-10-CM

## 2020-06-16 ENCOUNTER — Telehealth: Payer: Self-pay

## 2020-06-16 NOTE — Telephone Encounter (Addendum)
Patient called to cancel her BPP scheduled for Friday 06/17/20 - patient to have in OB office  Patient also unaware of future appointments - gave her next weeks appointment - she asked me to leave it for now.    May call back to cancel if having at [facility] office

## 2020-06-17 ENCOUNTER — Other Ambulatory Visit: Payer: No Typology Code available for payment source

## 2020-06-17 ENCOUNTER — Ambulatory Visit: Payer: No Typology Code available for payment source

## 2020-06-22 ENCOUNTER — Ambulatory Visit: Payer: No Typology Code available for payment source | Admitting: *Deleted

## 2020-06-22 ENCOUNTER — Observation Stay (HOSPITAL_COMMUNITY)
Admission: AD | Admit: 2020-06-22 | Discharge: 2020-06-23 | Disposition: A | Discharge status: Home / Self Care | Payer: No Typology Code available for payment source | Attending: Obstetrics and Gynecology | Admitting: Obstetrics and Gynecology

## 2020-06-22 ENCOUNTER — Ambulatory Visit (HOSPITAL_BASED_OUTPATIENT_CLINIC_OR_DEPARTMENT_OTHER): Payer: No Typology Code available for payment source

## 2020-06-22 ENCOUNTER — Encounter: Payer: Self-pay | Admitting: *Deleted

## 2020-06-22 ENCOUNTER — Other Ambulatory Visit: Payer: Self-pay

## 2020-06-22 ENCOUNTER — Ambulatory Visit: Payer: No Typology Code available for payment source

## 2020-06-22 ENCOUNTER — Encounter (HOSPITAL_COMMUNITY): Payer: Self-pay | Admitting: Obstetrics and Gynecology

## 2020-06-22 VITALS — BP 129/88 | HR 99

## 2020-06-22 DIAGNOSIS — Z3A33 33 weeks gestation of pregnancy: Secondary | ICD-10-CM

## 2020-06-22 DIAGNOSIS — O99213 Obesity complicating pregnancy, third trimester: Secondary | ICD-10-CM | POA: Insufficient documentation

## 2020-06-22 DIAGNOSIS — O30039 Twin pregnancy, monochorionic/diamniotic, unspecified trimester: Secondary | ICD-10-CM | POA: Insufficient documentation

## 2020-06-22 DIAGNOSIS — Z20822 Contact with and (suspected) exposure to covid-19: Secondary | ICD-10-CM | POA: Diagnosis not present

## 2020-06-22 DIAGNOSIS — E669 Obesity, unspecified: Secondary | ICD-10-CM

## 2020-06-22 DIAGNOSIS — O99210 Obesity complicating pregnancy, unspecified trimester: Secondary | ICD-10-CM | POA: Diagnosis present

## 2020-06-22 DIAGNOSIS — O10919 Unspecified pre-existing hypertension complicating pregnancy, unspecified trimester: Secondary | ICD-10-CM | POA: Diagnosis present

## 2020-06-22 DIAGNOSIS — O133 Gestational [pregnancy-induced] hypertension without significant proteinuria, third trimester: Secondary | ICD-10-CM | POA: Insufficient documentation

## 2020-06-22 DIAGNOSIS — Z363 Encounter for antenatal screening for malformations: Secondary | ICD-10-CM | POA: Diagnosis not present

## 2020-06-22 DIAGNOSIS — O30033 Twin pregnancy, monochorionic/diamniotic, third trimester: Secondary | ICD-10-CM | POA: Insufficient documentation

## 2020-06-22 DIAGNOSIS — O10013 Pre-existing essential hypertension complicating pregnancy, third trimester: Secondary | ICD-10-CM

## 2020-06-22 DIAGNOSIS — Z8751 Personal history of pre-term labor: Secondary | ICD-10-CM | POA: Diagnosis present

## 2020-06-22 DIAGNOSIS — Z6833 Body mass index (BMI) 33.0-33.9, adult: Secondary | ICD-10-CM

## 2020-06-22 DIAGNOSIS — Z8489 Family history of other specified conditions: Secondary | ICD-10-CM

## 2020-06-22 DIAGNOSIS — Z3A34 34 weeks gestation of pregnancy: Secondary | ICD-10-CM | POA: Diagnosis not present

## 2020-06-22 DIAGNOSIS — O26873 Cervical shortening, third trimester: Secondary | ICD-10-CM

## 2020-06-22 LAB — URINALYSIS, ROUTINE W REFLEX MICROSCOPIC
Bilirubin Urine: NEGATIVE
Glucose, UA: NEGATIVE mg/dL
Hgb urine dipstick: NEGATIVE
Ketones, ur: 20 mg/dL — AB
Leukocytes,Ua: NEGATIVE
Nitrite: NEGATIVE
Protein, ur: NEGATIVE mg/dL
Specific Gravity, Urine: 1.014 (ref 1.005–1.030)
pH: 6 (ref 5.0–8.0)

## 2020-06-22 LAB — CBC
HCT: 36.4 % (ref 36.0–46.0)
Hemoglobin: 11.8 g/dL — ABNORMAL LOW (ref 12.0–15.0)
MCH: 24.5 pg — ABNORMAL LOW (ref 26.0–34.0)
MCHC: 32.4 g/dL (ref 30.0–36.0)
MCV: 75.5 fL — ABNORMAL LOW (ref 80.0–100.0)
Platelets: 278 10*3/uL (ref 150–400)
RBC: 4.82 MIL/uL (ref 3.87–5.11)
RDW: 14.6 % (ref 11.5–15.5)
WBC: 12.5 10*3/uL — ABNORMAL HIGH (ref 4.0–10.5)
nRBC: 0 % (ref 0.0–0.2)

## 2020-06-22 LAB — COMPREHENSIVE METABOLIC PANEL
ALT: 24 U/L (ref 0–44)
AST: 20 U/L (ref 15–41)
Albumin: 2.6 g/dL — ABNORMAL LOW (ref 3.5–5.0)
Alkaline Phosphatase: 372 U/L — ABNORMAL HIGH (ref 38–126)
Anion gap: 9 (ref 5–15)
BUN: 10 mg/dL (ref 6–20)
CO2: 18 mmol/L — ABNORMAL LOW (ref 22–32)
Calcium: 9.2 mg/dL (ref 8.9–10.3)
Chloride: 104 mmol/L (ref 98–111)
Creatinine, Ser: 0.68 mg/dL (ref 0.44–1.00)
GFR, Estimated: >60 mL/min (ref >60)
Glucose, Bld: 81 mg/dL (ref 70–99)
Potassium: 3.7 mmol/L (ref 3.5–5.1)
Sodium: 131 mmol/L — ABNORMAL LOW (ref 135–145)
Total Bilirubin: 0.7 mg/dL (ref 0.3–1.2)
Total Protein: 6.6 g/dL (ref 6.5–8.1)

## 2020-06-22 LAB — PROTEIN / CREATININE RATIO, URINE
Creatinine, Urine: 104.5 mg/dL
Protein Creatinine Ratio: 0.15 mg/mg{Cre} (ref 0.00–0.15)
Total Protein, Urine: 16 mg/dL

## 2020-06-22 MED ORDER — PRENATAL MULTIVITAMIN CH
1.0000 | ORAL_TABLET | Freq: Every day | ORAL | Status: DC
  Filled 2020-06-22: qty 1

## 2020-06-22 MED ORDER — HYDRALAZINE HCL 20 MG/ML IJ SOLN: 10.0000 mg | INTRAMUSCULAR | Status: DC | PRN

## 2020-06-22 MED ORDER — LABETALOL HCL 5 MG/ML IV SOLN: 80.0000 mg | INTRAVENOUS | Status: DC | PRN

## 2020-06-22 MED ORDER — ZOLPIDEM TARTRATE 5 MG PO TABS: 5.0000 mg | ORAL_TABLET | Freq: Every evening | ORAL | Status: DC | PRN

## 2020-06-22 MED ORDER — ACETAMINOPHEN 325 MG PO TABS: 650.0000 mg | ORAL_TABLET | ORAL | Status: DC | PRN

## 2020-06-22 MED ORDER — CALCIUM CARBONATE ANTACID 500 MG PO CHEW
2.0000 | CHEWABLE_TABLET | ORAL | Status: DC | PRN
  Administered 2020-06-23: 400 mg via ORAL
  Filled 2020-06-22: qty 2

## 2020-06-22 MED ORDER — LABETALOL HCL 5 MG/ML IV SOLN: 40.0000 mg | INTRAVENOUS | Status: DC | PRN

## 2020-06-22 MED ORDER — NIFEDIPINE 10 MG PO CAPS: 10.0000 mg | ORAL_CAPSULE | ORAL | Status: DC | PRN

## 2020-06-22 MED ORDER — LACTATED RINGERS IV SOLN: INTRAVENOUS | Status: DC

## 2020-06-22 MED ORDER — LABETALOL HCL 5 MG/ML IV SOLN: 20.0000 mg | INTRAVENOUS | Status: DC | PRN

## 2020-06-22 MED ORDER — NIFEDIPINE 10 MG PO CAPS
10.0000 mg | ORAL_CAPSULE | ORAL | Status: DC | PRN
  Administered 2020-06-22 (×2): 10 mg via ORAL
  Filled 2020-06-22 (×2): qty 1

## 2020-06-22 MED ORDER — BETAMETHASONE SOD PHOS & ACET 6 (3-3) MG/ML IJ SUSP
12.0000 mg | INTRAMUSCULAR | Status: AC
  Administered 2020-06-23 (×2): 12 mg via INTRAMUSCULAR
  Filled 2020-06-22: qty 5

## 2020-06-22 MED ORDER — DOCUSATE SODIUM 100 MG PO CAPS
100.0000 mg | ORAL_CAPSULE | Freq: Every day | ORAL | Status: DC
  Filled 2020-06-22: qty 1

## 2020-06-22 MED ORDER — LACTATED RINGERS IV BOLUS
1000.0000 mL | Freq: Once | INTRAVENOUS | Status: AC
  Administered 2020-06-22: 1000 mL via INTRAVENOUS

## 2020-06-22 NOTE — MAU Provider Note (Addendum)
Chief Complaint:  NST   None     HPI: Ashley Durham is a 35 y.o. G1P0 at [redacted]w[redacted]d with mono/di twins pregnancy and PMHx significant for shortened cervix and advanced dilation at 4 cm since 29 weeks and transient HTN with single episode of HTN during this pregnancy that resolved who presents to maternity admissions sent from MAU for extended monitoring.  Per MFM, Baby B scored a 6/8 with no practice breathing.  Baby A had 8/8 today.  Pt reports good fetal movement. While in MAU she reports cramping became more painful and more regular than usual.  She denies h/a, epigastic pain, or visual disturbances.    HPI  Past Medical History: Past Medical History:  Diagnosis Date  . Alopecia   . Amenorrhea   . Hypertension   . Menstrual headache   . PCOS (polycystic ovarian syndrome)   . Sleep apnea     Past obstetric history: OB History  Gravida Para Term Preterm AB Living  1         0  SAB IAB Ectopic Multiple Live Births               # Outcome Date GA Lbr Len/2nd Weight Sex Delivery Anes PTL Lv  1 Current             Past Surgical History: Past Surgical History:  Procedure Laterality Date  . FOOT SURGERY  2008   extra bone removed from foot  . WISDOM TOOTH EXTRACTION      Family History: Family History  Problem Relation Age of Onset  . Arthritis Mother   . Cancer Mother   . Miscarriages / [place] Mother   . Obesity Mother   . Cancer Father   . Early death Father   . Obesity Father   . Cancer Maternal Grandfather   . Stroke Maternal Grandfather   . Diabetes Paternal Grandmother   . Heart disease Paternal Grandmother   . Obesity Paternal Grandmother   . Cancer Paternal Grandfather   . Obesity Paternal Grandfather     Social History: Social History   Tobacco Use  . Smoking status: Never Smoker  . Smokeless tobacco: Never Used  Vaping Use  . Vaping Use: Never used  Substance Use Topics  . Alcohol use: Not Currently    Comment: 1-2 times per month   .  Drug use: No    Allergies: No Known Allergies  Meds:  Medications Prior to Admission  Medication Sig Dispense Refill Last Dose  . Ascorbic Acid (VITAMIN C) 1000 MG tablet Take 1,000 mg by mouth daily.   06/22/2020 at 0830  . calcium carbonate (TUMS - DOSED IN MG ELEMENTAL CALCIUM) 500 MG chewable tablet Chew 1-2 tablets by mouth daily as needed for indigestion or heartburn.   Past Week at Unknown time  . cholecalciferol (VITAMIN D3) 25 MCG (1000 UNIT) tablet Take 1,000 Units by mouth daily.   06/22/2020 at 0830  . Prenatal Vit-Fe Fumarate-FA (PRENATAL MULTIVITAMIN) TABS tablet Take 1 tablet by mouth daily at 12 noon.   06/22/2020 at 0830  . acetaminophen (TYLENOL) 500 MG tablet Take 1,000 mg by mouth every 6 (six) hours as needed.   More than a month at Unknown time    ROS:  Review of Systems  Constitutional: Negative for chills, fatigue and fever.  Eyes: Negative for visual disturbance.  Respiratory: Negative for shortness of breath.   Cardiovascular: Negative for chest pain.  Gastrointestinal: Positive for abdominal pain. Negative for  nausea and vomiting.  Genitourinary: Negative for difficulty urinating, dysuria, flank pain, pelvic pain, vaginal bleeding, vaginal discharge and vaginal pain.  Neurological: Negative for dizziness and headaches.  Psychiatric/Behavioral: Negative.      I have reviewed patient's Past Medical Hx, Surgical Hx, Family Hx, Social Hx, medications and allergies.   Physical Exam   Patient Vitals for the past 24 hrs:  BP Temp Temp src Pulse Resp SpO2 Height Weight  06/22/20 1817 (!) 150/91 -- -- 97 -- 97 % -- --  06/22/20 1800 (!) 167/93 -- -- 99 18 97 % -- --  06/22/20 1754 (!) 158/96 -- -- 100 -- 97 % -- --  06/22/20 1745 (!) 154/109 98.2 F (36.8 C) Oral (!) 109 20 97 % 5\' 4"  (1.626 m) 135.2 kg   Constitutional: Well-developed, well-nourished female in no acute distress.  Cardiovascular: normal rate Respiratory: normal effort GI: Abd soft,  non-tender, gravid appropriate for gestational age.  MS: Extremities nontender, no edema, normal ROM Neurologic: Alert and oriented x 4.  GU: Neg CVAT.  PELVIC EXAM: Cervix pink, visually closed, without lesion, scant white creamy discharge, vaginal walls and external genitalia normal Bimanual exam: Cervix 0/long/high, firm, anterior, neg CMT, uterus nontender, nonenlarged, adnexa without tenderness, enlargement, or mass  Dilation: 4 Effacement (%): 100 Station: -3 Exam by:: L. Leftwich-kirby CNM  FHT:  Baby A: Baseline 140, moderate variability, accelerations present, no decelerations Baby B: Baseline 145 , moderate variability, accelerations present, no decelerations  Contractions: q 3-5 mins   Labs: Results for orders placed or performed during the hospital encounter of 06/22/20 (from the past 24 hour(s))  CBC     Status: Abnormal   Collection Time: 06/22/20  6:06 PM  Result Value Ref Range   WBC 12.5 (H) 4.0 - 10.5 K/uL   RBC 4.82 3.87 - 5.11 MIL/uL   Hemoglobin 11.8 (L) 12.0 - 15.0 g/dL   HCT 16.1 09.6 - 04.5 %   MCV 75.5 (L) 80.0 - 100.0 fL   MCH 24.5 (L) 26.0 - 34.0 pg   MCHC 32.4 30.0 - 36.0 g/dL   RDW 40.9 81.1 - 91.4 %   Platelets 278 150 - 400 K/uL   nRBC 0.0 0.0 - 0.2 %  Comprehensive metabolic panel     Status: Abnormal   Collection Time: 06/22/20  6:06 PM  Result Value Ref Range   Sodium 131 (L) 135 - 145 mmol/L   Potassium 3.7 3.5 - 5.1 mmol/L   Chloride 104 98 - 111 mmol/L   CO2 18 (L) 22 - 32 mmol/L   Glucose, Bld 81 70 - 99 mg/dL   BUN 10 6 - 20 mg/dL   Creatinine, Ser 7.82 0.44 - 1.00 mg/dL   Calcium 9.2 8.9 - 95.6 mg/dL   Total Protein 6.6 6.5 - 8.1 g/dL   Albumin 2.6 (L) 3.5 - 5.0 g/dL   AST 20 15 - 41 U/L   ALT 24 0 - 44 U/L   Alkaline Phosphatase 372 (H) 38 - 126 U/L   Total Bilirubin 0.7 0.3 - 1.2 mg/dL   GFR, Estimated >21 >30 mL/min   Anion gap 9 5 - 15  Protein / creatinine ratio, urine     Status: None   Collection Time: 06/22/20  6:06  PM  Result Value Ref Range   Creatinine, Urine 104.50 mg/dL   Total Protein, Urine 16 mg/dL   Protein Creatinine Ratio 0.15 0.00 - 0.15 mg/mg[Cre]  Urinalysis, Routine w reflex microscopic Urine, Clean Catch  Status: Abnormal   Collection Time: 06/22/20  6:44 PM  Result Value Ref Range   Color, Urine AMBER (A) YELLOW   APPearance CLOUDY (A) CLEAR   Specific Gravity, Urine 1.014 1.005 - 1.030   pH 6.0 5.0 - 8.0   Glucose, UA NEGATIVE NEGATIVE mg/dL   Hgb urine dipstick NEGATIVE NEGATIVE   Bilirubin Urine NEGATIVE NEGATIVE   Ketones, ur 20 (A) NEGATIVE mg/dL   Protein, ur NEGATIVE NEGATIVE mg/dL   Nitrite NEGATIVE NEGATIVE   Leukocytes,Ua NEGATIVE NEGATIVE   --/--/O POS (03/10 1407)  Imaging:  [place] MFM FETAL BPP WO NON STRESS  Result Date: 06/22/2020 ----------------------------------------------------------------------  OBSTETRICS REPORT                       (Signed Final 06/22/2020 05:17 pm) ---------------------------------------------------------------------- Patient Info  ID #:       [id number]                          D.O.B.:  12/29/1985 (34 yrs)  Name:       Ashley Durham              Visit Date: 06/22/2020 03:37 pm ---------------------------------------------------------------------- Performed By  Attending:        Lin Landsman      Ref. Address:     Therisa Doyne                    MD                                                             & Infertility                                                             [address]                                                             [place], [place]                                                             [address]  Performed By:     Fayne Norrie BS,      Location:         [facility]                    RDMS, RVT                                [facility]  Referred By:       Olivia Mackie                    MD ---------------------------------------------------------------------- Orders  #  Description                           Code        Ordered By  1  [place] MFM FETAL BPP WO NON               76819.01    RAVI SHANKAR     STRESS  2  [place] MFM OB FOLLOW UP                   76816.01    RAVI SHANKAR  3  [place] MFM FETAL BPP WO NST               76819.1     RAVI SHANKAR     ADDL GESTATION  4  [place] MFM OB FOLLOW UP ADDL              19509.32    RAVI SHANKAR     GEST ----------------------------------------------------------------------  #  Order #                     Accession #                Episode #  1  [id number]                   [id number]                 [id number]  2  [id number]                   [id number]                 [id number]  3  [id number]                   [id number]                 [id number]  4  [id number]                   [id number]                 [id number] ---------------------------------------------------------------------- Indications  Twin pregnancy, mono/di, third trimester       O30.033  Hypertension - Chronic/Pre-existing            O10.019  Obesity complicating pregnancy, third          O99.213  trimester  Antenatal screening for malformations          Z36.3  Low risk NIPS, NT WNL  Family history of genetic disorder (FOB)       Z84.89  [redacted] weeks gestation of pregnancy                Z3A.33 ---------------------------------------------------------------------- Fetal Evaluation (Fetus A)  Num Of Fetuses:         2  Fetal Heart Rate(bpm):  147  Cardiac Activity:       Observed  Fetal Lie:              Maternal right side  Presentation:           Cephalic  Placenta:               Posterior  P. Cord Insertion:      Not well visualized  Membrane Desc:      Dividing Membrane seen - Monochorionic  Amniotic Fluid  AFI FV:      Within normal limits                              Largest Pocket(cm)                              4.35  ---------------------------------------------------------------------- Biophysical Evaluation (Fetus A)  Amniotic F.V:   Pocket => 2 cm             F. Tone:        Observed  F. Movement:    Observed                   Score:          8/8  F. Breathing:   Observed ---------------------------------------------------------------------- Biometry (Fetus A)  BPD:      82.1  mm     G. Age:  33w 0d         23  %    CI:        72.74   %    70 - 86                                                          FL/HC:      20.5   %    19.4 - 21.8  HC:      306.1  mm     G. Age:  34w 1d         20  %    HC/AC:      1.04        0.96 - 1.11  AC:      295.1  mm     G. Age:  33w 3d         42  %    FL/BPD:     76.4   %    71 - 87  FL:       62.7  mm     G. Age:  32w 3d         10  %    FL/AC:      21.2   %    20 - 24  Est. FW:    2144  gm    4 lb 12 oz      25  %     FW Discordancy      0 \ 6 % ---------------------------------------------------------------------- OB History  Gravidity:    1         Term:   0        Prem:   0        SAB:  0  TOP:          0       Ectopic:  0        Living: 0 ---------------------------------------------------------------------- Gestational Age (Fetus A)  LMP:           36w 4d        Date:  10/10/19                 EDD:   07/16/20  U/S Today:     33w 2d                                        EDD:   08/08/20  Best:          33w 6d     Det. By:  Marcella Dubs         EDD:   08/04/20 ---------------------------------------------------------------------- Anatomy (Fetus A)  Cranium:               Appears normal         Kidneys:                Appear normal  Heart:                 Appears normal         Bladder:                Appears normal                         (4CH, axis, and                         situs)  Stomach:               Appears normal, left                         sided  Other:  Technicallly difficult due to advanced GA and maternal habitus and          fetal position.  ---------------------------------------------------------------------- Fetal Evaluation (Fetus B)  Num Of Fetuses:         2  Cardiac Activity:       Observed  Fetal Lie:              Maternal left side  Presentation:           Cephalic  Placenta:               Posterior  P. Cord Insertion:      Not well visualized  Membrane Desc:      Dividing Membrane seen - Monochorionic  Amniotic Fluid  AFI FV:      Within normal limits                              Largest Pocket(cm)                              3.78 ---------------------------------------------------------------------- Biophysical Evaluation (Fetus B)  Amniotic F.V:   Pocket => 2 cm             F. Tone:        Observed  F. Movement:  Observed                   Score:          6/8  F. Breathing:   Not Observed ---------------------------------------------------------------------- Biometry (Fetus B)  BPD:      76.5  mm     G. Age:  30w 5d        < 1  %    CI:        67.25   %    70 - 86                                                          FL/HC:      20.7   %    19.4 - 21.8  HC:      298.7  mm     G. Age:  33w 1d          6  %    HC/AC:      1.02        0.96 - 1.11  AC:      292.2  mm     G. Age:  33w 2d         34  %    FL/BPD:     80.7   %    71 - 87  FL:       61.7  mm     G. Age:  32w 0d          5  %    FL/AC:      21.1   %    20 - 24  LV:          4  mm  Est. FW:    2012  gm      4 lb 7 oz     13  %     FW Discordancy         6  % ---------------------------------------------------------------------- Gestational Age (Fetus B)  LMP:           36w 4d        Date:  10/10/19                 EDD:   07/16/20  U/S Today:     32w 2d                                        EDD:   08/15/20  Best:          33w 6d     Det. By:  Marcella Dubs         EDD:   08/04/20 ---------------------------------------------------------------------- Anatomy (Fetus B)  Cranium:               Appears normal         Stomach:                Appears normal, left  sided  Cavum:                 Appears normal         Kidneys:                Appear normal  Ventricles:            Appears normal         Bladder:                Appears normal  Heart:                 Appears normal                         (4CH, axis, and                         situs) ---------------------------------------------------------------------- Cervix Uterus Adnexa  Cervix  Not visualized (advanced GA >24wks) ---------------------------------------------------------------------- Impression  Ashley Durham is here with monochorionic diamniotic twin  pregnancy for growth and TTTS/TAPS screening.  She has known chronic hypertension that is stable 129/88.  Twin A normal stomach, amniotic fluid, bladder and BPP 8/8-  Cephalic EFW 25th  Twin B normal stomach, amniotic fluid, bladder and BPP 6/8-  Cephalic EFW 13%  Discoradance is at 6%.  I discussed today's visit and recommended continue weekly  testing and repeat growth in 3 weeks.  Given the BPP 6/8 in Twin B I have recommended she go to  MAU for NST.  I personally called MAU  All questions answered. ----------------------------------------------------------------------               Lin Landsman, MD Electronically Signed Final Report   06/22/2020 05:17 pm ----------------------------------------------------------------------  [place] MFM OB FOLLOW UP  Result Date: 06/22/2020 ----------------------------------------------------------------------  OBSTETRICS REPORT                       (Signed Final 06/22/2020 05:17 pm) ---------------------------------------------------------------------- Patient Info  ID #:       [id number]                          D.O.B.:  [birth year] (34 yrs)  Name:       Ashley Durham              Visit Date: 06/22/2020 03:37 pm ---------------------------------------------------------------------- Performed By  Attending:        Lin Landsman      Ref. Address:     Therisa Doyne                    MD                                                             & Infertility                                                             [address]  [place], [place]                                                             [address]  Performed By:     Fayne Norrie BS,      Location:         [facility]                    RDMS, RVT                                [facility] at                                                             [facility]  Referred By:      Olivia Mackie                    MD ---------------------------------------------------------------------- Orders  #  Description                           Code        Ordered By  1  [place] MFM FETAL BPP WO NON               76819.01    RAVI SHANKAR     STRESS  2  [place] MFM OB FOLLOW UP                   76816.01    RAVI SHANKAR  3  [place] MFM FETAL BPP WO NST               76819.1     RAVI SHANKAR     ADDL GESTATION  4  [place] MFM OB FOLLOW UP ADDL              60454.09    RAVI SHANKAR     GEST ----------------------------------------------------------------------  #  Order #                     Accession #                Episode #  1  [id number]                   [id number]                 [id number]  2  [id number]                   [id number]                 [id number]  3  [id number]                   [id number]                 [id number]  4  [id number]                   [id number]                 [id number] ---------------------------------------------------------------------- Indications  Twin pregnancy, mono/di, third trimester       O30.033  Hypertension - Chronic/Pre-existing            O10.019  Obesity complicating pregnancy, third          O99.213  trimester  Antenatal screening for malformations          Z36.3  Low risk NIPS, NT WNL  Family history of genetic disorder (FOB)       Z84.89  [redacted] weeks gestation  of pregnancy                Z3A.33 ---------------------------------------------------------------------- Fetal Evaluation (Fetus A)  Num Of Fetuses:         2  Fetal Heart Rate(bpm):  147  Cardiac Activity:       Observed  Fetal Lie:              Maternal right side  Presentation:           Cephalic  Placenta:               Posterior  P. Cord Insertion:      Not well visualized  Membrane Desc:      Dividing Membrane seen - Monochorionic  Amniotic Fluid  AFI FV:      Within normal limits                              Largest Pocket(cm)                              4.35 ---------------------------------------------------------------------- Biophysical Evaluation (Fetus A)  Amniotic F.V:   Pocket => 2 cm             F. Tone:        Observed  F. Movement:    Observed                   Score:          8/8  F. Breathing:   Observed ---------------------------------------------------------------------- Biometry (Fetus A)  BPD:      82.1  mm     G. Age:  33w 0d         23  %    CI:        72.74   %    70 - 86                                                          FL/HC:      20.5   %    19.4 - 21.8  HC:      306.1  mm     G. Age:  34w 1d  20  %    HC/AC:      1.04        0.96 - 1.11  AC:      295.1  mm     G. Age:  33w 3d         42  %    FL/BPD:     76.4   %    71 - 87  FL:       62.7  mm     G. Age:  32w 3d         10  %    FL/AC:      21.2   %    20 - 24  Est. FW:    2144  gm    4 lb 12 oz      25  %     FW Discordancy      0 \ 6 % ---------------------------------------------------------------------- OB History  Gravidity:    1         Term:   0        Prem:   0        SAB:   0  TOP:          0       Ectopic:  0        Living: 0 ---------------------------------------------------------------------- Gestational Age (Fetus A)  LMP:           36w 4d        Date:  10/10/19                 EDD:   07/16/20  U/S Today:     33w 2d                                        EDD:   08/08/20  Best:          33w 6d     Det.  By:  Marcella DubsEarly Ultrasound         EDD:   08/04/20 ---------------------------------------------------------------------- Anatomy (Fetus A)  Cranium:               Appears normal         Kidneys:                Appear normal  Heart:                 Appears normal         Bladder:                Appears normal                         (4CH, axis, and                         situs)  Stomach:               Appears normal, left                         sided  Other:  Technicallly difficult due to advanced GA and maternal habitus and          fetal position. ---------------------------------------------------------------------- Fetal Evaluation (Fetus B)  Num Of Fetuses:  2  Cardiac Activity:       Observed  Fetal Lie:              Maternal left side  Presentation:           Cephalic  Placenta:               Posterior  P. Cord Insertion:      Not well visualized  Membrane Desc:      Dividing Membrane seen - Monochorionic  Amniotic Fluid  AFI FV:      Within normal limits                              Largest Pocket(cm)                              3.78 ---------------------------------------------------------------------- Biophysical Evaluation (Fetus B)  Amniotic F.V:   Pocket => 2 cm             F. Tone:        Observed  F. Movement:    Observed                   Score:          6/8  F. Breathing:   Not Observed ---------------------------------------------------------------------- Biometry (Fetus B)  BPD:      76.5  mm     G. Age:  30w 5d        < 1  %    CI:        67.25   %    70 - 86                                                          FL/HC:      20.7   %    19.4 - 21.8  HC:      298.7  mm     G. Age:  33w 1d          6  %    HC/AC:      1.02        0.96 - 1.11  AC:      292.2  mm     G. Age:  33w 2d         34  %    FL/BPD:     80.7   %    71 - 87  FL:       61.7  mm     G. Age:  32w 0d          5  %    FL/AC:      21.1   %    20 - 24  LV:          4  mm  Est. FW:    2012  gm      4 lb 7 oz     13  %     FW  Discordancy         6  % ---------------------------------------------------------------------- Gestational Age (Fetus B)  LMP:           36w 4d  Date:  10/10/19                 EDD:   07/16/20  U/S Today:     32w 2d                                        EDD:   08/15/20  Best:          33w 6d     Det. By:  Marcella Dubs         EDD:   08/04/20 ---------------------------------------------------------------------- Anatomy (Fetus B)  Cranium:               Appears normal         Stomach:                Appears normal, left                                                                        sided  Cavum:                 Appears normal         Kidneys:                Appear normal  Ventricles:            Appears normal         Bladder:                Appears normal  Heart:                 Appears normal                         (4CH, axis, and                         situs) ---------------------------------------------------------------------- Cervix Uterus Adnexa  Cervix  Not visualized (advanced GA >24wks) ---------------------------------------------------------------------- Impression  Ashley Durham is here with monochorionic diamniotic twin  pregnancy for growth and TTTS/TAPS screening.  She has known chronic hypertension that is stable 129/88.  Twin A normal stomach, amniotic fluid, bladder and BPP 8/8-  Cephalic EFW 25th  Twin B normal stomach, amniotic fluid, bladder and BPP 6/8-  Cephalic EFW 13%  Discoradance is at 6%.  I discussed today's visit and recommended continue weekly  testing and repeat growth in 3 weeks.  Given the BPP 6/8 in Twin B I have recommended she go to  MAU for NST.  I personally called MAU  All questions answered. ----------------------------------------------------------------------               Lin Landsman, MD Electronically Signed Final Report   06/22/2020 05:17 pm ----------------------------------------------------------------------  [place] MFM OB FOLLOW UP ADDL  GEST  Result Date: 06/22/2020 ----------------------------------------------------------------------  OBSTETRICS REPORT                       (Signed Final 06/22/2020 05:17 pm) ---------------------------------------------------------------------- Patient Info  ID #:       [id number]  D.O.B.:  02/10/1986 (34 yrs)  Name:       Ashley Durham              Visit Date: 06/22/2020 03:37 pm ---------------------------------------------------------------------- Performed By  Attending:        Lin Landsman      Ref. Address:     Therisa Doyne                    MD                                                             & Infertility                                                             [address]                                                             [place], [place]                                                             [address]  Performed By:     Fayne Norrie BS,      Location:         [facility]                    RDMS, RVT                                Fetal Care at                                                             [facility]  Referred By:      Olivia Mackie                    MD ---------------------------------------------------------------------- Orders  #  Description                           Code  Ordered By  1  [place] MFM FETAL BPP WO NON               [id number]    RAVI SHANKAR     STRESS  2  [place] MFM OB FOLLOW UP                   76816.01    RAVI SHANKAR  3  [place] MFM FETAL BPP WO NST               76819.1     RAVI SHANKAR     ADDL GESTATION  4  [place] MFM OB FOLLOW UP ADDL              96045.40    RAVI SHANKAR     GEST ----------------------------------------------------------------------  #  Order #                     Accession #                Episode #  1  [id number]                   [id number]                 [id number]  2  [id number]                   [id number]  3  [id number]                   [id number]                 [id number]  4  [id number]                   [id number]                 [id number] ---------------------------------------------------------------------- Indications  Twin pregnancy, mono/di, third trimester       O30.033  Hypertension - Chronic/Pre-existing            O10.019  Obesity complicating pregnancy, third          O99.213  trimester  Antenatal screening for malformations          Z36.3  Low risk NIPS, NT WNL  Family history of genetic disorder (FOB)       Z84.89  [redacted] weeks gestation of pregnancy                Z3A.33 ---------------------------------------------------------------------- Fetal Evaluation (Fetus A)  Num Of Fetuses:         2  Fetal Heart Rate(bpm):  147  Cardiac Activity:       Observed  Fetal Lie:              Maternal right side  Presentation:           Cephalic  Placenta:               Posterior  P. Cord Insertion:      Not well visualized  Membrane Desc:      Dividing Membrane seen - Monochorionic  Amniotic Fluid  AFI FV:      Within normal limits                              Largest  Pocket(cm)                              4.35 ---------------------------------------------------------------------- Biophysical Evaluation (Fetus A)  Amniotic F.V:   Pocket => 2 cm             F. Tone:        Observed  F. Movement:    Observed                   Score:          8/8  F. Breathing:   Observed ---------------------------------------------------------------------- Biometry (Fetus A)  BPD:      82.1  mm     G. Age:  33w 0d         23  %    CI:        72.74   %    70 - 86                                                          FL/HC:      20.5   %    19.4 - 21.8  HC:      306.1  mm     G. Age:  34w 1d         20  %    HC/AC:      1.04        0.96 - 1.11  AC:      295.1  mm     G. Age:  33w 3d         42  %    FL/BPD:     76.4   %    71 - 87  FL:       62.7  mm     G. Age:  32w 3d         10  %    FL/AC:      21.2   %    20 - 24   Est. FW:    2144  gm    4 lb 12 oz      25  %     FW Discordancy      0 \ 6 % ---------------------------------------------------------------------- OB History  Gravidity:    1         Term:   0        Prem:   0        SAB:   0  TOP:          0       Ectopic:  0        Living: 0 ---------------------------------------------------------------------- Gestational Age (Fetus A)  LMP:           36w 4d        Date:  10/10/19                 EDD:   07/16/20  U/S Today:     33w 2d                                        EDD:   08/08/20  Best:          33w 6d     Det. By:  Marcella Dubs         EDD:   08/04/20 ---------------------------------------------------------------------- Anatomy (Fetus A)  Cranium:               Appears normal         Kidneys:                Appear normal  Heart:                 Appears normal         Bladder:                Appears normal                         (4CH, axis, and                         situs)  Stomach:               Appears normal, left                         sided  Other:  Technicallly difficult due to advanced GA and maternal habitus and          fetal position. ---------------------------------------------------------------------- Fetal Evaluation (Fetus B)  Num Of Fetuses:         2  Cardiac Activity:       Observed  Fetal Lie:              Maternal left side  Presentation:           Cephalic  Placenta:               Posterior  P. Cord Insertion:      Not well visualized  Membrane Desc:      Dividing Membrane seen - Monochorionic  Amniotic Fluid  AFI FV:      Within normal limits                              Largest Pocket(cm)                              3.78 ---------------------------------------------------------------------- Biophysical Evaluation (Fetus B)  Amniotic F.V:   Pocket => 2 cm             F. Tone:        Observed  F. Movement:    Observed                   Score:          6/8  F. Breathing:   Not Observed  ---------------------------------------------------------------------- Biometry (Fetus B)  BPD:      76.5  mm     G. Age:  30w 5d        < 1  %    CI:        67.25   %    70 - 86  FL/HC:      20.7   %    19.4 - 21.8  HC:      298.7  mm     G. Age:  33w 1d          6  %    HC/AC:      1.02        0.96 - 1.11  AC:      292.2  mm     G. Age:  33w 2d         34  %    FL/BPD:     80.7   %    71 - 87  FL:       61.7  mm     G. Age:  32w 0d          5  %    FL/AC:      21.1   %    20 - 24  LV:          4  mm  Est. FW:    2012  gm      4 lb 7 oz     13  %     FW Discordancy         6  % ---------------------------------------------------------------------- Gestational Age (Fetus B)  LMP:           36w 4d        Date:  10/10/19                 EDD:   07/16/20  U/S Today:     32w 2d                                        EDD:   08/15/20  Best:          33w 6d     Det. By:  Marcella Dubs         EDD:   08/04/20 ---------------------------------------------------------------------- Anatomy (Fetus B)  Cranium:               Appears normal         Stomach:                Appears normal, left                                                                        sided  Cavum:                 Appears normal         Kidneys:                Appear normal  Ventricles:            Appears normal         Bladder:                Appears normal  Heart:                 Appears normal                         (  4CH, axis, and                         situs) ---------------------------------------------------------------------- Cervix Uterus Adnexa  Cervix  Not visualized (advanced GA >24wks) ---------------------------------------------------------------------- Impression  Ms. Joslyne Marshburn is here with monochorionic diamniotic twin  pregnancy for growth and TTTS/TAPS screening.  She has known chronic hypertension that is stable 129/88.  Twin A normal stomach, amniotic fluid, bladder and  BPP 8/8-  Cephalic EFW 25th  Twin B normal stomach, amniotic fluid, bladder and BPP 6/8-  Cephalic EFW 13%  Discoradance is at 6%.  I discussed today's visit and recommended continue weekly  testing and repeat growth in 3 weeks.  Given the BPP 6/8 in Twin B I have recommended she go to  MAU for NST.  I personally called MAU  All questions answered. ----------------------------------------------------------------------               Lin Landsman, MD Electronically Signed Final Report   06/22/2020 05:17 pm ----------------------------------------------------------------------  [place] MFM FETAL BPP WO NST ADDL GESTATION  Result Date: 06/22/2020 ----------------------------------------------------------------------  OBSTETRICS REPORT                       (Signed Final 06/22/2020 05:17 pm) ---------------------------------------------------------------------- Patient Info  ID #:       [id number]                          D.O.B.:  Feb 01, 1986 (34 yrs)  Name:       Ashley Durham              Visit Date: 06/22/2020 03:37 pm ---------------------------------------------------------------------- Performed By  Attending:        Lin Landsman      Ref. Address:     Ther[facility] Doyne                    MD                                                             [facility]                                                             [address]                                                             [place], [place]                                                             [address]  Performed By:     Fayne Norrie BS,  Location:         [facility]                    RDMS, RVT                                [facility]                                                             [facility]  Referred By:      Olivia Mackie                    MD ---------------------------------------------------------------------- Orders  #   Description                           Code        Ordered By  1  [place] MFM FETAL BPP WO NON               16109.60    RAVI SHANKAR     STRESS  2  [place] MFM OB FOLLOW UP                   76816.01    RAVI SHANKAR  3  [place] MFM FETAL BPP WO NST               76819.1     RAVI SHANKAR     ADDL GESTATION  4  [place] MFM OB FOLLOW UP ADDL              45409.81    RAVI SHANKAR     GEST ----------------------------------------------------------------------  #  Order #                     Accession #                Episode #  1  [id number]                   [id number]                 [id number]  2  [id number]                   [id number]                 [id number]  3  [id number]                   [id number]                 [id number]  4  [id number]                   [id number]  [id number] ---------------------------------------------------------------------- Indications  Twin pregnancy, mono/di, third trimester       O30.033  Hypertension - Chronic/Pre-existing            O10.019  Obesity complicating pregnancy, third          O99.213  trimester  Antenatal screening for malformations          Z36.3  Low risk NIPS, NT WNL  Family history of genetic disorder (FOB)       Z84.89  [redacted] weeks gestation of pregnancy                Z3A.33 ---------------------------------------------------------------------- Fetal Evaluation (Fetus A)  Num Of Fetuses:         2  Fetal Heart Rate(bpm):  147  Cardiac Activity:       Observed  Fetal Lie:              Maternal right side  Presentation:           Cephalic  Placenta:               Posterior  P. Cord Insertion:      Not well visualized  Membrane Desc:      Dividing Membrane seen - Monochorionic  Amniotic Fluid  AFI FV:      Within normal limits                              Largest Pocket(cm)                              4.35 ---------------------------------------------------------------------- Biophysical Evaluation (Fetus A)  Amniotic F.V:   Pocket => 2 cm             F. Tone:        Observed  F.  Movement:    Observed                   Score:          8/8  F. Breathing:   Observed ---------------------------------------------------------------------- Biometry (Fetus A)  BPD:      82.1  mm     G. Age:  33w 0d         23  %    CI:        72.74   %    70 - 86                                                          FL/HC:      20.5   %    19.4 - 21.8  HC:      306.1  mm     G. Age:  34w 1d         20  %    HC/AC:      1.04        0.96 - 1.11  AC:      295.1  mm     G. Age:  33w 3d         42  %    FL/BPD:     76.4   %    71 - 87  FL:  62.7  mm     G. Age:  32w 3d         10  %    FL/AC:      21.2   %    20 - 24  Est. FW:    2144  gm    4 lb 12 oz      25  %     FW Discordancy      0 \ 6 % ---------------------------------------------------------------------- OB History  Gravidity:    1         Term:   0        Prem:   0        SAB:   0  TOP:          0       Ectopic:  0        Living: 0 ---------------------------------------------------------------------- Gestational Age (Fetus A)  LMP:           36w 4d        Date:  10/10/19                 EDD:   07/16/20  U/S Today:     33w 2d                                        EDD:   08/08/20  Best:          33w 6d     Det. By:  Marcella Dubs         EDD:   08/04/20 ---------------------------------------------------------------------- Anatomy (Fetus A)  Cranium:               Appears normal         Kidneys:                Appear normal  Heart:                 Appears normal         Bladder:                Appears normal                         (4CH, axis, and                         situs)  Stomach:               Appears normal, left                         sided  Other:  Technicallly difficult due to advanced GA and maternal habitus and          fetal position. ---------------------------------------------------------------------- Fetal Evaluation (Fetus B)  Num Of Fetuses:         2  Cardiac Activity:       Observed  Fetal Lie:              Maternal left  side  Presentation:           Cephalic  Placenta:               Posterior  P. Cord Insertion:      Not well visualized  Membrane Desc:  Dividing Membrane seen - Monochorionic  Amniotic Fluid  AFI FV:      Within normal limits                              Largest Pocket(cm)                              3.78 ---------------------------------------------------------------------- Biophysical Evaluation (Fetus B)  Amniotic F.V:   Pocket => 2 cm             F. Tone:        Observed  F. Movement:    Observed                   Score:          6/8  F. Breathing:   Not Observed ---------------------------------------------------------------------- Biometry (Fetus B)  BPD:      76.5  mm     G. Age:  30w 5d        < 1  %    CI:        67.25   %    70 - 86                                                          FL/HC:      20.7   %    19.4 - 21.8  HC:      298.7  mm     G. Age:  33w 1d          6  %    HC/AC:      1.02        0.96 - 1.11  AC:      292.2  mm     G. Age:  33w 2d         34  %    FL/BPD:     80.7   %    71 - 87  FL:       61.7  mm     G. Age:  32w 0d          5  %    FL/AC:      21.1   %    20 - 24  LV:          4  mm  Est. FW:    2012  gm      4 lb 7 oz     13  %     FW Discordancy         6  % ---------------------------------------------------------------------- Gestational Age (Fetus B)  LMP:           36w 4d        Date:  10/10/19                 EDD:   07/16/20  U/S Today:     32w 2d                                        EDD:   08/15/20  Best:  33w 6d     Det. By:  Marcella Dubs         EDD:   08/04/20 ---------------------------------------------------------------------- Anatomy (Fetus B)  Cranium:               Appears normal         Stomach:                Appears normal, left                                                                        sided  Cavum:                 Appears normal         Kidneys:                Appear normal  Ventricles:            Appears normal          Bladder:                Appears normal  Heart:                 Appears normal                         (4CH, axis, and                         situs) ---------------------------------------------------------------------- Cervix Uterus Adnexa  Cervix  Not visualized (advanced GA >24wks) ---------------------------------------------------------------------- Impression  Ms. Shandrea Lusk is here with monochorionic diamniotic twin  pregnancy for growth and TTTS/TAPS screening.  She has known chronic hypertension that is stable 129/88.  Twin A normal stomach, amniotic fluid, bladder and BPP 8/8-  Cephalic EFW 25th  Twin B normal stomach, amniotic fluid, bladder and BPP 6/8-  Cephalic EFW 13%  Discoradance is at 6%.  I discussed today's visit and recommended continue weekly  testing and repeat growth in 3 weeks.  Given the BPP 6/8 in Twin B I have recommended she go to  MAU for NST.  I personally called MAU  All questions answered. ----------------------------------------------------------------------               Lin Landsman, MD Electronically Signed Final Report   06/22/2020 05:17 pm ----------------------------------------------------------------------    MAU Course/MDM: Orders Placed This Encounter  Procedures  . CBC  . Comprehensive metabolic panel  . Protein / creatinine ratio, urine  . Urinalysis, Routine w reflex microscopic Urine, Clean Catch  . Notify Physician  . Measure blood pressure    Meds ordered this encounter  Medications  . AND Linked Order Group   . labetalol (NORMODYNE) injection 20 mg   . labetalol (NORMODYNE) injection 40 mg   . labetalol (NORMODYNE) injection 80 mg   . hydrALAZINE (APRESOLINE) injection 10 mg  . lactated ringers bolus 1,000 mL     NST reviewed and reactive x 2 Pt with advanced dilation but unchanged from previous exams BP elevated, 1 severe range, PRN IV medications ordered but none given as BP remained below 160/110 No s/sx of PEC Consult Dr Alysia Penna  with presentation, exam findings and  test results.  Likely new onset GHTN, no s/sx of PEC today LR x 1000 ml given, Procardia ordered Plan to recheck cervix if contractions persist  Report to Wynelle Bourgeois, CNM  Sharen Counter Certified Nurse-Midwife 06/22/2020 8:48 PM    2100hrs:   Still feeling contractions, though not much IVF has infused. Will go ahead and start Procardia series.   2220:  BP dropped after first dose but pt feels fine.  WIll give second dose. Consulted Dr Billy Coast with presentation, results and pt response. Patient was feeling her contractions more even after first dose of Procardia.   Cervix examined and is unchanged  US done today , both twins vtx/vtx  NICU consulted and agrees to accept patient to be admitted for overnight observation Will give rescue dose of Betamethasone (patient states she is really glad we are doing this).  Ivonne Andrew and Dr Billy Coast will reassess in AM.   Aviva Signs, CNM

## 2020-06-22 NOTE — MAU Note (Signed)
Sent from MFM for NST secondary during ultrasound baby B not having practice breathing.  Denies VB or LOF.  Endorses +FM.

## 2020-06-22 NOTE — H&P (Signed)
Chief Complaint:  NST   None     HPI: Ayla Corona Cal is a 35 y.o. G1P0 at [redacted]w[redacted]d with mono/di twins pregnancy and PMHx significant for shortened cervix and advanced dilation at 4 cm since 29 weeks and transient HTN with single episode of HTN during this pregnancy that resolved who presents to maternity admissions sent from [facility] for extended monitoring.  Per MFM, Baby B scored a 6/8 with no practice breathing.  Baby A had 8/8 today.  Pt reports good fetal movement. While in MAU she reports cramping became more painful and more regular than usual.  She denies h/a, epigastic pain, or visual disturbances.    HPI  Past Medical History: Past Medical History:  Diagnosis Date  . Alopecia   . Amenorrhea   . Hypertension   . Menstrual headache   . PCOS (polycystic ovarian syndrome)   . Sleep apnea     Past obstetric history: OB History  Gravida Para Term Preterm AB Living  1         0  SAB IAB Ectopic Multiple Live Births               # Outcome Date GA Lbr Len/2nd Weight Sex Delivery Anes PTL Lv  1 Current             Past Surgical History: Past Surgical History:  Procedure Laterality Date  . FOOT SURGERY  2008   extra bone removed from foot  . WISDOM TOOTH EXTRACTION      Family History: Family History  Problem Relation Age of Onset  . Arthritis Mother   . Cancer Mother   . Miscarriages / Stillbirths Mother   . Obesity Mother   . Cancer Father   . Early death Father   . Obesity Father   . Cancer Maternal Grandfather   . Stroke Maternal Grandfather   . Diabetes Paternal Grandmother   . Heart disease Paternal Grandmother   . Obesity Paternal Grandmother   . Cancer Paternal Grandfather   . Obesity Paternal Grandfather     Social History: Social History   Tobacco Use  . Smoking status: Never Smoker  . Smokeless tobacco: Never Used  Vaping Use  . Vaping Use: Never used  Substance Use Topics  . Alcohol use: Not Currently    Comment: 1-2 times per month   .  Drug use: No    Allergies: No Known Allergies  Meds:  Medications Prior to Admission  Medication Sig Dispense Refill Last Dose  . Ascorbic Acid (VITAMIN C) 1000 MG tablet Take 1,000 mg by mouth daily.   06/22/2020 at 0830  . calcium carbonate (TUMS - DOSED IN MG ELEMENTAL CALCIUM) 500 MG chewable tablet Chew 1-2 tablets by mouth daily as needed for indigestion or heartburn.   Past Week at Unknown time  . cholecalciferol (VITAMIN D3) 25 MCG (1000 UNIT) tablet Take 1,000 Units by mouth daily.   06/22/2020 at 0830  . Prenatal Vit-Fe Fumarate-FA (PRENATAL MULTIVITAMIN) TABS tablet Take 1 tablet by mouth daily at 12 noon.   06/22/2020 at 0830  . acetaminophen (TYLENOL) 500 MG tablet Take 1,000 mg by mouth every 6 (six) hours as needed.   More than a month at Unknown time    ROS:  Review of Systems  Constitutional: Negative for chills, fatigue and fever.  Eyes: Negative for visual disturbance.  Respiratory: Negative for shortness of breath.   Cardiovascular: Negative for chest pain.  Gastrointestinal: Positive for abdominal pain. Negative for   nausea and vomiting.  Genitourinary: Negative for difficulty urinating, dysuria, flank pain, pelvic pain, vaginal bleeding, vaginal discharge and vaginal pain.  Neurological: Negative for dizziness and headaches.  Psychiatric/Behavioral: Negative.      I have reviewed patient's Past Medical Hx, Surgical Hx, Family Hx, Social Hx, medications and allergies.   Physical Exam   Patient Vitals for the past 24 hrs:  BP Temp Temp src Pulse Resp SpO2 Height Weight  06/22/20 1817 (!) 150/91 -- -- 97 -- 97 % -- --  06/22/20 1800 (!) 167/93 -- -- 99 18 97 % -- --  06/22/20 1754 (!) 158/96 -- -- 100 -- 97 % -- --  06/22/20 1745 (!) 154/109 98.2 F (36.8 C) Oral (!) 109 20 97 % 5' 4" (1.626 m) 135.2 kg   Constitutional: Well-developed, well-nourished female in no acute distress.  Cardiovascular: normal rate Respiratory: normal effort GI: Abd soft,  non-tender, gravid appropriate for gestational age.  MS: Extremities nontender, no edema, normal ROM Neurologic: Alert and oriented x 4.  GU: Neg CVAT.  PELVIC EXAM: Cervix pink, visually closed, without lesion, scant white creamy discharge, vaginal walls and external genitalia normal Bimanual exam: Cervix 0/long/high, firm, anterior, neg CMT, uterus nontender, nonenlarged, adnexa without tenderness, enlargement, or mass  Dilation: 4 Effacement (%): 100 Station: -3 Exam by:: L. Leftwich-kirby CNM  FHT:  Baby A: Baseline 140, moderate variability, accelerations present, no decelerations Baby B: Baseline 145 , moderate variability, accelerations present, no decelerations  Contractions: q 3-5 mins   Labs: Results for orders placed or performed during the hospital encounter of 06/22/20 (from the past 24 hour(s))  CBC     Status: Abnormal   Collection Time: 06/22/20  6:06 PM  Result Value Ref Range   WBC 12.5 (H) 4.0 - 10.5 K/uL   RBC 4.82 3.87 - 5.11 MIL/uL   Hemoglobin 11.8 (L) 12.0 - 15.0 g/dL   HCT 36.4 36.0 - 46.0 %   MCV 75.5 (L) 80.0 - 100.0 fL   MCH 24.5 (L) 26.0 - 34.0 pg   MCHC 32.4 30.0 - 36.0 g/dL   RDW 14.6 11.5 - 15.5 %   Platelets 278 150 - 400 K/uL   nRBC 0.0 0.0 - 0.2 %  Comprehensive metabolic panel     Status: Abnormal   Collection Time: 06/22/20  6:06 PM  Result Value Ref Range   Sodium 131 (L) 135 - 145 mmol/L   Potassium 3.7 3.5 - 5.1 mmol/L   Chloride 104 98 - 111 mmol/L   CO2 18 (L) 22 - 32 mmol/L   Glucose, Bld 81 70 - 99 mg/dL   BUN 10 6 - 20 mg/dL   Creatinine, Ser 0.68 0.44 - 1.00 mg/dL   Calcium 9.2 8.9 - 10.3 mg/dL   Total Protein 6.6 6.5 - 8.1 g/dL   Albumin 2.6 (L) 3.5 - 5.0 g/dL   AST 20 15 - 41 U/L   ALT 24 0 - 44 U/L   Alkaline Phosphatase 372 (H) 38 - 126 U/L   Total Bilirubin 0.7 0.3 - 1.2 mg/dL   GFR, Estimated >60 >60 mL/min   Anion gap 9 5 - 15  Protein / creatinine ratio, urine     Status: None   Collection Time: 06/22/20  6:06  PM  Result Value Ref Range   Creatinine, Urine 104.50 mg/dL   Total Protein, Urine 16 mg/dL   Protein Creatinine Ratio 0.15 0.00 - 0.15 mg/mg[Cre]  Urinalysis, Routine w reflex microscopic Urine, Clean Catch       Status: Abnormal   Collection Time: 06/22/20  6:44 PM  Result Value Ref Range   Color, Urine AMBER (A) YELLOW   APPearance CLOUDY (A) CLEAR   Specific Gravity, Urine 1.014 1.005 - 1.030   pH 6.0 5.0 - 8.0   Glucose, UA NEGATIVE NEGATIVE mg/dL   Hgb urine dipstick NEGATIVE NEGATIVE   Bilirubin Urine NEGATIVE NEGATIVE   Ketones, ur 20 (A) NEGATIVE mg/dL   Protein, ur NEGATIVE NEGATIVE mg/dL   Nitrite NEGATIVE NEGATIVE   Leukocytes,Ua NEGATIVE NEGATIVE   --/--/O POS (03/10 1407)  Imaging:  US MFM FETAL BPP WO NON STRESS  Result Date: 06/22/2020 ----------------------------------------------------------------------  OBSTETRICS REPORT                       (Signed Final 06/22/2020 05:17 pm) ---------------------------------------------------------------------- Patient Info  ID #:       [id number]                          D.O.B.:  05/29/1985 (34 yrs)  Name:       Clista CORONA CAL              Visit Date: 06/22/2020 03:37 pm ---------------------------------------------------------------------- Performed By  Attending:        Corenthian Booker      Ref. Address:     [facility]                    MD                                                             & [facility]ertility                                                             [address]                                                             [place], [place]                                                             [id number]  Performed By:     Carolyn Isley BS,      Location:         [facility]                    RDMS, RVT                                [facility]  Referred By:       RICHARD TAAVON                    MD ---------------------------------------------------------------------- Orders  #  Description                           Code        Ordered By  1  US MFM FETAL BPP WO NON               76819.01    RAVI SHANKAR     STRESS  2  US MFM OB FOLLOW UP                   76816.01    RAVI SHANKAR  3  US MFM FETAL BPP WO NST               76819.1     RAVI SHANKAR     ADDL GESTATION  4  US MFM OB FOLLOW UP ADDL              76816.02    RAVI SHANKAR     GEST ----------------------------------------------------------------------  #  Order #                     Accession #                Episode #  1  [id number]                   [id number]                 [id number]  2  [id number]                   [id number]                 [id number]  3  [id number]                   [id number]                 [id number]  4  [id number]                   [id number]                 [id number] ---------------------------------------------------------------------- Indications  Twin pregnancy, mono/di, third trimester       O30.033  Hypertension - Chronic/Pre-existing            O10.019  Obesity complicating pregnancy, third          O99.213  trimester  Antenatal screening for malformations          Z36.3  Low risk NIPS, NT WNL  Family history of genetic disorder (FOB)       Z84.89  [redacted] weeks gestation of pregnancy                Z3A.33 ---------------------------------------------------------------------- Fetal Evaluation (Fetus A)  Num Of Fetuses:         2    Fetal Heart Rate(bpm):  147  Cardiac Activity:       Observed  Fetal Lie:              Maternal right side  Presentation:           Cephalic  Placenta:               Posterior  P. Cord Insertion:      Not well visualized  Membrane Desc:      Dividing Membrane seen - Monochorionic  Amniotic Fluid  AFI FV:      Within normal limits                              Largest Pocket(cm)                              4.35  ---------------------------------------------------------------------- Biophysical Evaluation (Fetus A)  Amniotic F.V:   Pocket => 2 cm             F. Tone:        Observed  F. Movement:    Observed                   Score:          8/8  F. Breathing:   Observed ---------------------------------------------------------------------- Biometry (Fetus A)  BPD:      82.1  mm     G. Age:  33w 0d         23  %    CI:        72.74   %    70 - 86                                                          FL/HC:      20.5   %    19.4 - 21.8  HC:      306.1  mm     G. Age:  34w 1d         20  %    HC/AC:      1.04        0.96 - 1.11  AC:      295.1  mm     G. Age:  33w 3d         42  %    FL/BPD:     76.4   %    71 - 87  FL:       62.7  mm     G. Age:  32w 3d         10  %    FL/AC:      21.2   %    20 - 24  Est. FW:    2144  gm    4 lb 12 oz      25  %     FW Discordancy      0 \ 6 % ---------------------------------------------------------------------- OB History  Gravidity:    1         Term:   0        Prem:   0        SAB:     0  TOP:          0       Ectopic:  0        Living: 0 ---------------------------------------------------------------------- Gestational Age (Fetus A)  LMP:           36w 4d        Date:  10/10/19                 EDD:   07/16/20  U/S Today:     33w 2d                                        EDD:   08/08/20  Best:          33w 6d     Det. By:  Early Ultrasound         EDD:   08/04/20 ---------------------------------------------------------------------- Anatomy (Fetus A)  Cranium:               Appears normal         Kidneys:                Appear normal  Heart:                 Appears normal         Bladder:                Appears normal                         (4CH, axis, and                         situs)  Stomach:               Appears normal, left                         sided  Other:  Technicallly difficult due to advanced GA and maternal habitus and          fetal position.  ---------------------------------------------------------------------- Fetal Evaluation (Fetus B)  Num Of Fetuses:         2  Cardiac Activity:       Observed  Fetal Lie:              Maternal left side  Presentation:           Cephalic  Placenta:               Posterior  P. Cord Insertion:      Not well visualized  Membrane Desc:      Dividing Membrane seen - Monochorionic  Amniotic Fluid  AFI FV:      Within normal limits                              Largest Pocket(cm)                              3.78 ---------------------------------------------------------------------- Biophysical Evaluation (Fetus B)  Amniotic F.V:   Pocket => 2 cm             F. Tone:        Observed  F. Movement:      Observed                   Score:          6/8  F. Breathing:   Not Observed ---------------------------------------------------------------------- Biometry (Fetus B)  BPD:      76.5  mm     G. Age:  30w 5d        < 1  %    CI:        67.25   %    70 - 86                                                          FL/HC:      20.7   %    19.4 - 21.8  HC:      298.7  mm     G. Age:  33w 1d          6  %    HC/AC:      1.02        0.96 - 1.11  AC:      292.2  mm     G. Age:  33w 2d         34  %    FL/BPD:     80.7   %    71 - 87  FL:       61.7  mm     G. Age:  32w 0d          5  %    FL/AC:      21.1   %    20 - 24  LV:          4  mm  Est. FW:    2012  gm      4 lb 7 oz     13  %     FW Discordancy         6  % ---------------------------------------------------------------------- Gestational Age (Fetus B)  LMP:           36w 4d        Date:  10/10/19                 EDD:   07/16/20  U/S Today:     32w 2d                                        EDD:   08/15/20  Best:          33w 6d     Det. By:  Early Ultrasound         EDD:   08/04/20 ---------------------------------------------------------------------- Anatomy (Fetus B)  Cranium:               Appears normal         Stomach:                Appears normal, left                                                                           sided  Cavum:                 Appears normal         Kidneys:                Appear normal  Ventricles:            Appears normal         Bladder:                Appears normal  Heart:                 Appears normal                         (4CH, axis, and                         situs) ---------------------------------------------------------------------- Cervix Uterus Adnexa  Cervix  Not visualized (advanced GA >24wks) ---------------------------------------------------------------------- Impression  Ms. Corona Cal is here with monochorionic diamniotic twin  pregnancy for growth and TTTS/TAPS screening.  She has known chronic hypertension that is stable 129/88.  Twin A normal stomach, amniotic fluid, bladder and BPP 8/8-  Cephalic EFW 25th  Twin B normal stomach, amniotic fluid, bladder and BPP 6/8-  Cephalic EFW 13%  Discoradance is at 6%.  I discussed today's visit and recommended continue weekly  testing and repeat growth in 3 weeks.  Given the BPP 6/8 in Twin B I have recommended she go to  MAU for NST.  I personally called MAU  All questions answered. ----------------------------------------------------------------------               Corenthian Booker, MD Electronically Signed Final Report   06/22/2020 05:17 pm ----------------------------------------------------------------------  US MFM OB FOLLOW UP  Result Date: 06/22/2020 ----------------------------------------------------------------------  OBSTETRICS REPORT                       (Signed Final 06/22/2020 05:17 pm) ---------------------------------------------------------------------- Patient Info  ID #:       [id number]                          D.O.B.:  03/04/1986 (34 yrs)  Name:       Voncile CORONA CAL              Visit Date: 06/22/2020 03:37 pm ---------------------------------------------------------------------- Performed By  Attending:        Corenthian Booker      Ref. Address:     [facility]                    MD                                                             & [facility]                                                             [address]                                                               [place], [place]                                                             [address]  Performed By:     Carolyn Isley BS,      Location:         [facility]                    [facility], [facility]T                                [facility]  Referred By:      RICHARD TAAVON                    MD ---------------------------------------------------------------------- Orders  #  Description                           Code        Ordered By  1  US MFM FETAL BPP WO NON               76819.01    RAVI SHANKAR     STRESS  2  US MFM OB FOLLOW UP                   76816.01    RAVI SHANKAR  3  US MFM FETAL BPP WO NST               76819.1     RAVI SHANKAR     ADDL GESTATION  4  US MFM OB FOLLOW UP ADDL              76816.02    RAVI SHANKAR     GEST ----------------------------------------------------------------------  #  Order #                     Accession #                Episode #  1  [id number]                   [id number]                 [id number]  2  [id number]                   [id number]                 [id number]    3  [id number]                   [id number]                 [id number]  4  [id number]                   [id number]                 [id number] ---------------------------------------------------------------------- Indications  Twin pregnancy, mono/di, third trimester       O30.033  Hypertension - Chronic/Pre-existing            O10.019  Obesity complicating pregnancy, third          O99.213  trimester  Antenatal screening for malformations          Z36.3  Low risk NIPS, NT WNL  Family history of genetic disorder (FOB)       Z84.89  [redacted] weeks gestation  of pregnancy                Z3A.33 ---------------------------------------------------------------------- Fetal Evaluation (Fetus A)  Num Of Fetuses:         2  Fetal Heart Rate(bpm):  147  Cardiac Activity:       Observed  Fetal Lie:              Maternal right side  Presentation:           Cephalic  Placenta:               Posterior  P. Cord Insertion:      Not well visualized  Membrane Desc:      Dividing Membrane seen - Monochorionic  Amniotic Fluid  AFI FV:      Within normal limits                              Largest Pocket(cm)                              4.35 ---------------------------------------------------------------------- Biophysical Evaluation (Fetus A)  Amniotic F.V:   Pocket => 2 cm             F. Tone:        Observed  F. Movement:    Observed                   Score:          8/8  F. Breathing:   Observed ---------------------------------------------------------------------- Biometry (Fetus A)  BPD:      82.1  mm     G. Age:  33w 0d         23  %    CI:        72.74   %    70 - 86                                                          FL/HC:      20.5   %    19.4 - 21.8  HC:      306.1  mm     G. Age:  34w 1d           20  %    HC/AC:      1.04        0.96 - 1.11  AC:      295.1  mm     G. Age:  33w 3d         42  %    FL/BPD:     76.4   %    71 - 87  FL:       62.7  mm     G. Age:  32w 3d         10  %    FL/AC:      21.2   %    20 - 24  Est. FW:    2144  gm    4 lb 12 oz      25  %     FW Discordancy      0 \ 6 % ---------------------------------------------------------------------- OB History  Gravidity:    1         Term:   0        Prem:   0        SAB:   0  TOP:          0       Ectopic:  0        Living: 0 ---------------------------------------------------------------------- Gestational Age (Fetus A)  LMP:           36w 4d        Date:  10/10/19                 EDD:   07/16/20  U/S Today:     33w 2d                                        EDD:   08/08/20  Best:          33w 6d     Det.  By:  Early Ultrasound         EDD:   08/04/20 ---------------------------------------------------------------------- Anatomy (Fetus A)  Cranium:               Appears normal         Kidneys:                Appear normal  Heart:                 Appears normal         Bladder:                Appears normal                         (4CH, axis, and                         situs)  Stomach:               Appears normal, left                         sided  Other:  Technicallly difficult due to advanced GA and maternal habitus and          fetal position. ---------------------------------------------------------------------- Fetal Evaluation (Fetus B)  Num Of Fetuses:           2  Cardiac Activity:       Observed  Fetal Lie:              Maternal left side  Presentation:           Cephalic  Placenta:               Posterior  P. Cord Insertion:      Not well visualized  Membrane Desc:      Dividing Membrane seen - Monochorionic  Amniotic Fluid  AFI FV:      Within normal limits                              Largest Pocket(cm)                              3.78 ---------------------------------------------------------------------- Biophysical Evaluation (Fetus B)  Amniotic F.V:   Pocket => 2 cm             F. Tone:        Observed  F. Movement:    Observed                   Score:          6/8  F. Breathing:   Not Observed ---------------------------------------------------------------------- Biometry (Fetus B)  BPD:      76.5  mm     G. Age:  30w 5d        < 1  %    CI:        67.25   %    70 - 86                                                          FL/HC:      20.7   %    19.4 - 21.8  HC:      298.7  mm     G. Age:  33w 1d          6  %    HC/AC:      1.02        0.96 - 1.11  AC:      292.2  mm     G. Age:  33w 2d         34  %    FL/BPD:     80.7   %    71 - 87  FL:       61.7  mm     G. Age:  32w 0d          5  %    FL/AC:      21.1   %    20 - 24  LV:          4  mm  Est. FW:    2012  gm      4 lb 7 oz     13  %     FW  Discordancy         6  % ---------------------------------------------------------------------- Gestational Age (Fetus B)  LMP:           36w 4d          Date:  10/10/19                 EDD:   07/16/20  U/S Today:     32w 2d                                        EDD:   08/15/20  Best:          33w 6d     Det. By:  Early Ultrasound         EDD:   08/04/20 ---------------------------------------------------------------------- Anatomy (Fetus B)  Cranium:               Appears normal         Stomach:                Appears normal, left                                                                        sided  Cavum:                 Appears normal         Kidneys:                Appear normal  Ventricles:            Appears normal         Bladder:                Appears normal  Heart:                 Appears normal                         (4CH, axis, and                         situs) ---------------------------------------------------------------------- Cervix Uterus Adnexa  Cervix  Not visualized (advanced GA >24wks) ---------------------------------------------------------------------- Impression  Ms. Corona Cal is here with monochorionic diamniotic twin  pregnancy for growth and TTTS/TAPS screening.  She has known chronic hypertension that is stable 129/88.  Twin A normal stomach, amniotic fluid, bladder and BPP 8/8-  Cephalic EFW 25th  Twin B normal stomach, amniotic fluid, bladder and BPP 6/8-  Cephalic EFW 13%  Discoradance is at 6%.  I discussed today's visit and recommended continue weekly  testing and repeat growth in 3 weeks.  Given the BPP 6/8 in Twin B I have recommended she go to  MAU for NST.  I personally called MAU  All questions answered. ----------------------------------------------------------------------               Corenthian Booker, MD Electronically Signed Final Report   06/22/2020 05:17 pm ----------------------------------------------------------------------  US MFM OB FOLLOW UP ADDL  GEST  Result Date: 06/22/2020 ----------------------------------------------------------------------  OBSTETRICS REPORT                       (Signed Final 06/22/2020 05:17 pm) ---------------------------------------------------------------------- Patient Info  ID #:       [id number]                            D.O.B.:  09/03/1985 (34 yrs)  Name:       Marchia CORONA CAL              Visit Date: 06/22/2020 03:37 pm ---------------------------------------------------------------------- Performed By  Attending:        Corenthian Booker      Ref. Address:     [facility]                    MD                                                             & Infertility                                                             [address]                                                             [place], [place]                                                             [id number]  Performed By:     Carolyn Isley BS,      Location:         [facility]                    RDMS, RVT                                [facility]  Referred By:      RICHARD TAAVON                    MD ---------------------------------------------------------------------- Orders  #  Description                           Code          Ordered By  1  US MFM FETAL BPP WO NON               76819.01    RAVI SHANKAR     STRESS  2  US MFM OB FOLLOW UP                   76816.01    RAVI SHANKAR  3  US MFM FETAL BPP WO NST               76819.1     RAVI SHANKAR     ADDL GESTATION  4  US MFM OB FOLLOW UP ADDL              76816.02    RAVI SHANKAR     GEST ----------------------------------------------------------------------  #  Order #                     Accession #                Episode #  1  [id number]                   [id number]                 [id number]  2  [id number]                   [id number]                  [id number]  3  [id number]                   [id number]                 [id number]  4  [id number]                   [id number]                 [id number] ---------------------------------------------------------------------- Indications  Twin pregnancy, mono/di, third trimester       O30.033  Hypertension - Chronic/Pre-existing            O10.019  Obesity complicating pregnancy, third          O99.213  trimester  Antenatal screening for malformations          Z36.3  Low risk NIPS, NT WNL  Family history of genetic disorder (FOB)       Z84.89  [redacted] weeks gestation of pregnancy                Z3A.33 ---------------------------------------------------------------------- Fetal Evaluation (Fetus A)  Num Of Fetuses:         2  Fetal Heart Rate(bpm):  147  Cardiac Activity:       Observed  Fetal Lie:              Maternal right side  Presentation:           Cephalic  Placenta:               Posterior  P. Cord Insertion:      Not well visualized  Membrane Desc:      Dividing Membrane seen - Monochorionic  Amniotic Fluid  AFI FV:      Within normal limits                              Largest   Pocket(cm)                              4.35 ---------------------------------------------------------------------- Biophysical Evaluation (Fetus A)  Amniotic F.V:   Pocket => 2 cm             F. Tone:        Observed  F. Movement:    Observed                   Score:          8/8  F. Breathing:   Observed ---------------------------------------------------------------------- Biometry (Fetus A)  BPD:      82.1  mm     G. Age:  33w 0d         23  %    CI:        72.74   %    70 - 86                                                          FL/HC:      20.5   %    19.4 - 21.8  HC:      306.1  mm     G. Age:  34w 1d         20  %    HC/AC:      1.04        0.96 - 1.11  AC:      295.1  mm     G. Age:  33w 3d         42  %    FL/BPD:     76.4   %    71 - 87  FL:       62.7  mm     G. Age:  32w 3d         10  %    FL/AC:      21.2   %    20 - 24   Est. FW:    2144  gm    4 lb 12 oz      25  %     FW Discordancy      0 \ 6 % ---------------------------------------------------------------------- OB History  Gravidity:    1         Term:   0        Prem:   0        SAB:   0  TOP:          0       Ectopic:  0        Living: 0 ---------------------------------------------------------------------- Gestational Age (Fetus A)  LMP:           36w 4d        Date:  10/10/19                 EDD:   07/16/20  U/S Today:     33w 2d                                        EDD:   08/08/20    Best:          33w 6d     Det. By:  Early Ultrasound         EDD:   08/04/20 ---------------------------------------------------------------------- Anatomy (Fetus A)  Cranium:               Appears normal         Kidneys:                Appear normal  Heart:                 Appears normal         Bladder:                Appears normal                         (4CH, axis, and                         situs)  Stomach:               Appears normal, left                         sided  Other:  Technicallly difficult due to advanced GA and maternal habitus and          fetal position. ---------------------------------------------------------------------- Fetal Evaluation (Fetus B)  Num Of Fetuses:         2  Cardiac Activity:       Observed  Fetal Lie:              Maternal left side  Presentation:           Cephalic  Placenta:               Posterior  P. Cord Insertion:      Not well visualized  Membrane Desc:      Dividing Membrane seen - Monochorionic  Amniotic Fluid  AFI FV:      Within normal limits                              Largest Pocket(cm)                              3.78 ---------------------------------------------------------------------- Biophysical Evaluation (Fetus B)  Amniotic F.V:   Pocket => 2 cm             F. Tone:        Observed  F. Movement:    Observed                   Score:          6/8  F. Breathing:   Not Observed  ---------------------------------------------------------------------- Biometry (Fetus B)  BPD:      76.5  mm     G. Age:  30w 5d        < 1  %    CI:        67.25   %    70 - 86                                                            FL/HC:      20.7   %    19.4 - 21.8  HC:      298.7  mm     G. Age:  33w 1d          6  %    HC/AC:      1.02        0.96 - 1.11  AC:      292.2  mm     G. Age:  33w 2d         34  %    FL/BPD:     80.7   %    71 - 87  FL:       61.7  mm     G. Age:  32w 0d          5  %    FL/AC:      21.1   %    20 - 24  LV:          4  mm  Est. FW:    2012  gm      4 lb 7 oz     13  %     FW Discordancy         6  % ---------------------------------------------------------------------- Gestational Age (Fetus B)  LMP:           36w 4d        Date:  10/10/19                 EDD:   07/16/20  U/S Today:     32w 2d                                        EDD:   08/15/20  Best:          33w 6d     Det. By:  Early Ultrasound         EDD:   08/04/20 ---------------------------------------------------------------------- Anatomy (Fetus B)  Cranium:               Appears normal         Stomach:                Appears normal, left                                                                        sided  Cavum:                 Appears normal         Kidneys:                Appear normal  Ventricles:            Appears normal         Bladder:                Appears normal  Heart:                 Appears normal                         (  4CH, axis, and                         situs) ---------------------------------------------------------------------- Cervix Uterus Adnexa  Cervix  Not visualized (advanced GA >24wks) ---------------------------------------------------------------------- Impression  Ms. Corona Cal is here with monochorionic diamniotic twin  pregnancy for growth and TTTS/TAPS screening.  She has known chronic hypertension that is stable 129/88.  Twin A normal stomach, amniotic fluid, bladder and  BPP 8/8-  Cephalic EFW 25th  Twin B normal stomach, amniotic fluid, bladder and BPP 6/8-  Cephalic EFW 13%  Discoradance is at 6%.  I discussed today's visit and recommended continue weekly  testing and repeat growth in 3 weeks.  Given the BPP 6/8 in Twin B I have recommended she go to  MAU for NST.  I personally called MAU  All questions answered. ----------------------------------------------------------------------               Corenthian Booker, MD Electronically Signed Final Report   06/22/2020 05:17 pm ----------------------------------------------------------------------  US MFM FETAL BPP WO NST ADDL GESTATION  Result Date: 06/22/2020 ----------------------------------------------------------------------  OBSTETRICS REPORT                       (Signed Final 06/22/2020 05:17 pm) ---------------------------------------------------------------------- Patient Info  ID #:       [id number]                          D.O.B.:  10/02/1985 (34 yrs)  Name:       Laiza CORONA CAL              Visit Date: 06/22/2020 03:37 pm ---------------------------------------------------------------------- Performed By  Attending:        Corenthian Booker      Ref. Address:     [facility]                    MD                                                             [facility]                                                             [address]                                                             [place], [place]                                                             [address]  Performed By:     Carolyn Isley BS,        Location:         [facility]                    RDMS, RVT                                [facility]  Referred By:      RICHARD TAAVON                    MD ---------------------------------------------------------------------- Orders  #   Description                           Code        Ordered By  1  US MFM FETAL BPP WO NON               76819.01    RAVI SHANKAR     STRESS  2  US MFM OB FOLLOW UP                   76816.01    RAVI SHANKAR  3  US MFM FETAL BPP WO NST               76819.1     RAVI SHANKAR     ADDL GESTATION  4  US MFM OB FOLLOW UP ADDL              76816.02    RAVI SHANKAR     GEST ----------------------------------------------------------------------  #  Order #                     Accession #                Episode #  1  [id number]                   [id number]                 [id number]  2  [id number]                   [id number]                 [id number]  3  [id number]                   [id number]                 [id number]  4  [id number]                   [id number]                   [id number] ---------------------------------------------------------------------- Indications  Twin pregnancy, mono/di, third trimester       O30.033  Hypertension - Chronic/Pre-existing            O10.019  Obesity complicating pregnancy, third          O99.213  trimester  Antenatal screening for malformations          Z36.3  Low risk NIPS, NT WNL  Family history of genetic disorder (FOB)       Z84.89  [redacted] weeks gestation of pregnancy                Z3A.33 ---------------------------------------------------------------------- Fetal Evaluation (Fetus A)  Num Of Fetuses:         2  Fetal Heart Rate(bpm):  147  Cardiac Activity:       Observed  Fetal Lie:              Maternal right side  Presentation:           Cephalic  Placenta:               Posterior  P. Cord Insertion:      Not well visualized  Membrane Desc:      Dividing Membrane seen - Monochorionic  Amniotic Fluid  AFI FV:      Within normal limits                              Largest Pocket(cm)                              4.35 ---------------------------------------------------------------------- Biophysical Evaluation (Fetus A)  Amniotic F.V:   Pocket => 2 cm             F. Tone:        Observed  F.  Movement:    Observed                   Score:          8/8  F. Breathing:   Observed ---------------------------------------------------------------------- Biometry (Fetus A)  BPD:      82.1  mm     G. Age:  33w 0d         23  %    CI:        72.74   %    70 - 86                                                          FL/HC:      20.5   %    19.4 - 21.8  HC:      306.1  mm     G. Age:  34w 1d         20  %    HC/AC:      1.04        0.96 - 1.11  AC:      295.1  mm     G. Age:  33w 3d         42  %    FL/BPD:     76.4   %    71 - 87  FL:         62.7  mm     G. Age:  32w 3d         10  %    FL/AC:      21.2   %    20 - 24  Est. FW:    2144  gm    4 lb 12 oz      25  %     FW Discordancy      0 \ 6 % ---------------------------------------------------------------------- OB History  Gravidity:    1         Term:   0        Prem:   0        SAB:   0  TOP:          0       Ectopic:  0        Living: 0 ---------------------------------------------------------------------- Gestational Age (Fetus A)  LMP:           36w 4d        Date:  10/10/19                 EDD:   07/16/20  U/S Today:     33w 2d                                        EDD:   08/08/20  Best:          33w 6d     Det. By:  Early Ultrasound         EDD:   08/04/20 ---------------------------------------------------------------------- Anatomy (Fetus A)  Cranium:               Appears normal         Kidneys:                Appear normal  Heart:                 Appears normal         Bladder:                Appears normal                         (4CH, axis, and                         situs)  Stomach:               Appears normal, left                         sided  Other:  Technicallly difficult due to advanced GA and maternal habitus and          fetal position. ---------------------------------------------------------------------- Fetal Evaluation (Fetus B)  Num Of Fetuses:         2  Cardiac Activity:       Observed  Fetal Lie:              Maternal left  side  Presentation:           Cephalic  Placenta:               Posterior  P. Cord Insertion:      Not well visualized  Membrane Desc:        Dividing Membrane seen - Monochorionic  Amniotic Fluid  AFI FV:      Within normal limits                              Largest Pocket(cm)                              3.78 ---------------------------------------------------------------------- Biophysical Evaluation (Fetus B)  Amniotic F.V:   Pocket => 2 cm             F. Tone:        Observed  F. Movement:    Observed                   Score:          6/8  F. Breathing:   Not Observed ---------------------------------------------------------------------- Biometry (Fetus B)  BPD:      76.5  mm     G. Age:  30w 5d        < 1  %    CI:        67.25   %    70 - 86                                                          FL/HC:      20.7   %    19.4 - 21.8  HC:      298.7  mm     G. Age:  33w 1d          6  %    HC/AC:      1.02        0.96 - 1.11  AC:      292.2  mm     G. Age:  33w 2d         34  %    FL/BPD:     80.7   %    71 - 87  FL:       61.7  mm     G. Age:  32w 0d          5  %    FL/AC:      21.1   %    20 - 24  LV:          4  mm  Est. FW:    2012  gm      4 lb 7 oz     13  %     FW Discordancy         6  % ---------------------------------------------------------------------- Gestational Age (Fetus B)  LMP:           36w 4d        Date:  10/10/19                 EDD:   07/16/20  U/S Today:     32w 2d                                        EDD:   08/15/20  Best:            33w 6d     Det. By:  Early Ultrasound         EDD:   08/04/20 ---------------------------------------------------------------------- Anatomy (Fetus B)  Cranium:               Appears normal         Stomach:                Appears normal, left                                                                        sided  Cavum:                 Appears normal         Kidneys:                Appear normal  Ventricles:            Appears normal          Bladder:                Appears normal  Heart:                 Appears normal                         (4CH, axis, and                         situs) ---------------------------------------------------------------------- Cervix Uterus Adnexa  Cervix  Not visualized (advanced GA >24wks) ---------------------------------------------------------------------- Impression  Ms. Corona Cal is here with monochorionic diamniotic twin  pregnancy for growth and TTTS/TAPS screening.  She has known chronic hypertension that is stable 129/88.  Twin A normal stomach, amniotic fluid, bladder and BPP 8/8-  Cephalic EFW 25th  Twin B normal stomach, amniotic fluid, bladder and BPP 6/8-  Cephalic EFW 13%  Discoradance is at 6%.  I discussed today's visit and recommended continue weekly  testing and repeat growth in 3 weeks.  Given the BPP 6/8 in Twin B I have recommended she go to  MAU for NST.  I personally called MAU  All questions answered. ----------------------------------------------------------------------               Corenthian Booker, MD Electronically Signed Final Report   06/22/2020 05:17 pm ----------------------------------------------------------------------    MAU Course/MDM: Orders Placed This Encounter  Procedures  . CBC  . Comprehensive metabolic panel  . Protein / creatinine ratio, urine  . Urinalysis, Routine w reflex microscopic Urine, Clean Catch  . Notify Physician  . Measure blood pressure    Meds ordered this encounter  Medications  . AND Linked Order Group   . labetalol (NORMODYNE) injection 20 mg   . labetalol (NORMODYNE) injection 40 mg   . labetalol (NORMODYNE) injection 80 mg   . hydrALAZINE (APRESOLINE) injection 10 mg  . lactated ringers bolus 1,000 mL     NST reviewed and reactive x 2 Pt with advanced dilation but unchanged from previous exams BP elevated, 1 severe range, PRN IV medications ordered but none given as BP remained below 160/110 No s/sx of PEC Consult Dr Ervin  with presentation, exam findings and   test results.  Likely new onset GHTN, no s/sx of PEC today LR x 1000 ml given, Procardia ordered Plan to recheck cervix if contractions persist  Report to Ryka Beighley, CNM  Lisa Leftwich-Kirby Certified Nurse-Midwife 06/22/2020 8:48 PM    2100hrs:   Still feeling contractions, though not much IVF has infused. Will go ahead and start Procardia series.   2220:  BP dropped after first dose but pt feels fine.  WIll give second dose. Consulted Dr Taavon with presentation, results and pt response. Patient was feeling her contractions more even after first dose of Procardia.   Cervix examined and is unchanged  US done today , both twins vtx/vtx  NICU consulted and agrees to accept patient to be admitted for overnight observation Will give rescue dose of Betamethasone (patient states she is really glad we are doing this).  Daniella Paul and Dr Taavon will reassess in AM.   Avacyn Kloosterman L, CNM     

## 2020-06-23 DIAGNOSIS — O10919 Unspecified pre-existing hypertension complicating pregnancy, unspecified trimester: Secondary | ICD-10-CM | POA: Diagnosis present

## 2020-06-23 DIAGNOSIS — O30033 Twin pregnancy, monochorionic/diamniotic, third trimester: Secondary | ICD-10-CM | POA: Diagnosis present

## 2020-06-23 DIAGNOSIS — O99210 Obesity complicating pregnancy, unspecified trimester: Secondary | ICD-10-CM | POA: Diagnosis present

## 2020-06-23 LAB — RESP PANEL BY RT-PCR (FLU A&B, COVID) ARPGX2
Influenza A by PCR: NEGATIVE
Influenza B by PCR: NEGATIVE
SARS Coronavirus 2 by RT PCR: NEGATIVE

## 2020-06-23 LAB — TYPE AND SCREEN
ABO/RH(D): O POS
Antibody Screen: NEGATIVE

## 2020-06-23 NOTE — Progress Notes (Signed)
Reviewed antenatal discharge instructions with patient regarding kick counts, signs and symptoms of labor, signs and symptoms of pre-e, when to call MD/go to MAU, medications, and to continue with follow up OB appointments. Patient verbalized understanding of antenatal discharge instructions and asked appropriate questions.

## 2020-06-23 NOTE — Progress Notes (Signed)
Patient ID: Ashley Durham, female   DOB: May 05, 1985, 35 y.o.   MRN: [id number] HD 1 No complaints No bleeding or LOF. Good FM No change in dc. Contractions irregular BP (!) 137/45 (BP Location: Right Arm) Comment: RN NOTIFIED  Pulse 98   Temp 98.5 F (36.9 C) (Oral)   Resp 18   Ht 5\' 4"  (1.626 m)   Wt 135.2 kg   LMP 10/10/2019   SpO2 97%   BMI 51.17 kg/m    NCAT Lungs: CTA CV: RRR Abd: gravid , NT VE : 3/100/-1  FHR x 2 reactive Toco: Occ UI and rare contractions this am  IMP 34 wk DIDI twins with concordant growth Preterm cervical change- no evidence PTL, VE stable  GDM- BS stable. GBS pos BP stable , labs nl Rescue course BMZ #2 today DC home PEC precautions and PTL precautions.

## 2020-06-23 NOTE — Plan of Care (Signed)
  Problem: Education: °Goal: Knowledge of General Education information will improve °Description: Including pain rating scale, medication(s)/side effects and non-pharmacologic comfort measures °Outcome: Adequate for Discharge °  °Problem: Health Behavior/Discharge Planning: °Goal: Ability to manage health-related needs will improve °Outcome: Adequate for Discharge °  °Problem: Clinical Measurements: °Goal: Ability to maintain clinical measurements within normal limits will improve °Outcome: Adequate for Discharge °Goal: Will remain free from infection °Outcome: Adequate for Discharge °Goal: Diagnostic test results will improve °Outcome: Adequate for Discharge °Goal: Respiratory complications will improve °Outcome: Adequate for Discharge °Goal: Cardiovascular complication will be avoided °Outcome: Adequate for Discharge °  °Problem: Activity: °Goal: Risk for activity intolerance will decrease °Outcome: Adequate for Discharge °  °Problem: Nutrition: °Goal: Adequate nutrition will be maintained °Outcome: Adequate for Discharge °  °Problem: Coping: °Goal: Level of anxiety will decrease °Outcome: Adequate for Discharge °  °Problem: Elimination: °Goal: Will not experience complications related to bowel motility °Outcome: Adequate for Discharge °Goal: Will not experience complications related to urinary retention °Outcome: Adequate for Discharge °  °Problem: Pain Managment: °Goal: General experience of comfort will improve °Outcome: Adequate for Discharge °  °Problem: Safety: °Goal: Ability to remain free from injury will improve °Outcome: Adequate for Discharge °  °Problem: Skin Integrity: °Goal: Risk for impaired skin integrity will decrease °Outcome: Adequate for Discharge °  °Problem: Education: °Goal: Knowledge of disease or condition will improve °Outcome: Adequate for Discharge °Goal: Knowledge of the prescribed therapeutic regimen will improve °Outcome: Adequate for Discharge °  °Problem: Fluid Volume: °Goal:  Peripheral tissue perfusion will improve °Outcome: Adequate for Discharge °  °Problem: Clinical Measurements: °Goal: Complications related to disease process, condition or treatment will be avoided or minimized °Outcome: Adequate for Discharge °  °

## 2020-06-27 NOTE — Discharge Summary (Addendum)
OB Discharge Summary  Patient Name: Ashley Durham DOB: 1985-04-06 MRN: [id number]  Date of admission: 06/23/2020   Admitting diagnosis: Preterm labor [O60.00] Intrauterine pregnancy: [redacted]w[redacted]d     Secondary diagnosis: Patient Active Problem List   Diagnosis Date Noted   Monochorionic diamniotic twin gestation in third trimester 06/23/2020   Chronic hypertension affecting pregnancy 06/23/2020   Obesity complicating pregnancy 06/23/2020   Preterm labor 05/19/2020     Date of discharge: 06/23/2020   Discharge diagnosis: Active Problems:   Preterm labor   Monochorionic diamniotic twin gestation in third trimester   Chronic hypertension affecting pregnancy   Obesity complicating pregnancy                                                                 Hospital course:  BMZ administered. Tocolysis without complication.  Physical exam  Vitals:   06/22/20 2340 06/23/20 0330 06/23/20 0728 06/23/20 1147  BP: 134/65 135/79 (!) 137/45 139/74  Pulse: (!) 103 91 98 98  Resp: 18 18 18 19   Temp: 98 F (36.7 C) 98.1 F (36.7 C) 98.5 F (36.9 C) 97.9 F (36.6 C)  TempSrc: Oral Oral Oral Oral  SpO2: 98% 98% 97% 98%  Weight:      Height:       General: alert Lochia: appropriate Uterine Fundus: firm Incision: N/A Perineum: repair na, na edema DVT Evaluation: No evidence of DVT seen on physical exam. Labs: Lab Results  Component Value Date   WBC 12.5 (H) 06/22/2020   HGB 11.8 (L) 06/22/2020   HCT 36.4 06/22/2020   MCV 75.5 (L) 06/22/2020   PLT 278 06/22/2020   CMP Latest Ref Rng & Units 06/22/2020  Glucose 70 - 99 mg/dL 81  BUN 6 - 20 mg/dL 10  Creatinine 06/24/2020 - 7.62 mg/dL 8.31  Sodium 5.17 - 616 mmol/L 131(L)  Potassium 3.5 - 5.1 mmol/L 3.7  Chloride 98 - 111 mmol/L 104  CO2 22 - 32 mmol/L 18(L)  Calcium 8.9 - 10.3 mg/dL 9.2  Total Protein 6.5 - 8.1 g/dL 6.6  Total Bilirubin 0.3 - 1.2 mg/dL 0.7  Alkaline Phos 38 - 126 U/L 372(H)  AST 15 - 41 U/L 20  ALT 0 - 44 U/L 24    No flowsheet data found. Vaccines: TDaP          na         Flu             na                    COVID-19 na  Discharge instruction:  per After Visit Summary,  [facility] booklet and  [facility] hospital booklet  After Visit Meds:  Allergies as of 06/23/2020   No Known Allergies      Medication List     TAKE these medications    acetaminophen 500 MG tablet Commonly known as: TYLENOL Take 1,000 mg by mouth every 6 (six) hours as needed.   calcium carbonate 500 MG chewable tablet Commonly known as: TUMS - dosed in mg elemental calcium Chew 1-2 tablets by mouth daily as needed for indigestion or heartburn.   cholecalciferol 25 MCG (1000 UNIT) tablet Commonly known as: VITAMIN D3 Take 1,000  Units by mouth daily.   prenatal multivitamin Tabs tablet Take 1 tablet by mouth daily at 12 noon.   vitamin C 1000 MG tablet Take 1,000 mg by mouth daily.       2 Diet: carb modified diet  Activity: Advance as tolerated. .         Signed: Lenoard Aden, CNM, MSN 06/27/2020, 6:36 AM

## 2020-06-28 ENCOUNTER — Other Ambulatory Visit: Payer: Self-pay

## 2020-06-28 ENCOUNTER — Inpatient Hospital Stay (HOSPITAL_COMMUNITY)
Admission: AD | Admit: 2020-06-28 | Discharge: 2020-07-01 | DRG: 805 | Disposition: A | Discharge status: Home / Self Care | Payer: No Typology Code available for payment source | Attending: Obstetrics and Gynecology | Admitting: Obstetrics and Gynecology

## 2020-06-28 DIAGNOSIS — Z8632 Personal history of gestational diabetes: Secondary | ICD-10-CM | POA: Diagnosis present

## 2020-06-28 DIAGNOSIS — O10919 Unspecified pre-existing hypertension complicating pregnancy, unspecified trimester: Secondary | ICD-10-CM | POA: Diagnosis present

## 2020-06-28 DIAGNOSIS — O24420 Gestational diabetes mellitus in childbirth, diet controlled: Secondary | ICD-10-CM | POA: Diagnosis present

## 2020-06-28 DIAGNOSIS — O1002 Pre-existing essential hypertension complicating childbirth: Principal | ICD-10-CM | POA: Diagnosis present

## 2020-06-28 DIAGNOSIS — Z20822 Contact with and (suspected) exposure to covid-19: Secondary | ICD-10-CM | POA: Diagnosis present

## 2020-06-28 DIAGNOSIS — Z3A34 34 weeks gestation of pregnancy: Secondary | ICD-10-CM

## 2020-06-28 DIAGNOSIS — O43123 Velamentous insertion of umbilical cord, third trimester: Secondary | ICD-10-CM | POA: Diagnosis present

## 2020-06-28 DIAGNOSIS — O24410 Gestational diabetes mellitus in pregnancy, diet controlled: Secondary | ICD-10-CM | POA: Diagnosis present

## 2020-06-28 DIAGNOSIS — O99824 Streptococcus B carrier state complicating childbirth: Secondary | ICD-10-CM | POA: Diagnosis present

## 2020-06-28 DIAGNOSIS — O30033 Twin pregnancy, monochorionic/diamniotic, third trimester: Secondary | ICD-10-CM | POA: Diagnosis present

## 2020-06-28 DIAGNOSIS — O3433 Maternal care for cervical incompetence, third trimester: Secondary | ICD-10-CM | POA: Diagnosis present

## 2020-06-28 NOTE — MAU Note (Signed)
PT SAYS 1 WEEK AGO  SHE WAS 4 CM . Marland Kitchen  UC STRONG SINCE 10PM.   BOTH BABIES VERTEX.

## 2020-06-29 ENCOUNTER — Encounter (HOSPITAL_COMMUNITY)
Admission: AD | Disposition: A | Discharge status: Home / Self Care | Payer: Self-pay | Source: Home / Self Care | Attending: Obstetrics and Gynecology

## 2020-06-29 ENCOUNTER — Encounter (HOSPITAL_COMMUNITY): Payer: Self-pay

## 2020-06-29 ENCOUNTER — Inpatient Hospital Stay (HOSPITAL_COMMUNITY): Payer: No Typology Code available for payment source | Admitting: Anesthesiology

## 2020-06-29 DIAGNOSIS — Z20822 Contact with and (suspected) exposure to covid-19: Secondary | ICD-10-CM | POA: Diagnosis present

## 2020-06-29 DIAGNOSIS — O43123 Velamentous insertion of umbilical cord, third trimester: Secondary | ICD-10-CM | POA: Diagnosis present

## 2020-06-29 DIAGNOSIS — Z3A34 34 weeks gestation of pregnancy: Secondary | ICD-10-CM | POA: Diagnosis not present

## 2020-06-29 DIAGNOSIS — O26893 Other specified pregnancy related conditions, third trimester: Secondary | ICD-10-CM | POA: Diagnosis present

## 2020-06-29 DIAGNOSIS — O1002 Pre-existing essential hypertension complicating childbirth: Secondary | ICD-10-CM | POA: Diagnosis present

## 2020-06-29 DIAGNOSIS — O99824 Streptococcus B carrier state complicating childbirth: Secondary | ICD-10-CM | POA: Diagnosis present

## 2020-06-29 DIAGNOSIS — O24420 Gestational diabetes mellitus in childbirth, diet controlled: Secondary | ICD-10-CM | POA: Diagnosis present

## 2020-06-29 DIAGNOSIS — O30033 Twin pregnancy, monochorionic/diamniotic, third trimester: Secondary | ICD-10-CM | POA: Diagnosis present

## 2020-06-29 DIAGNOSIS — O3433 Maternal care for cervical incompetence, third trimester: Secondary | ICD-10-CM | POA: Diagnosis present

## 2020-06-29 LAB — CBC
HCT: 39 % (ref 36.0–46.0)
HCT: 39.2 % (ref 36.0–46.0)
HCT: 34.8 % — ABNORMAL LOW (ref 36.0–46.0)
Hemoglobin: 12.3 g/dL (ref 12.0–15.0)
Hemoglobin: 12.5 g/dL (ref 12.0–15.0)
Hemoglobin: 11.3 g/dL — ABNORMAL LOW (ref 12.0–15.0)
MCH: 23.7 pg — ABNORMAL LOW (ref 26.0–34.0)
MCH: 23.9 pg — ABNORMAL LOW (ref 26.0–34.0)
MCH: 24.3 pg — ABNORMAL LOW (ref 26.0–34.0)
MCHC: 31.5 g/dL (ref 30.0–36.0)
MCHC: 31.9 g/dL (ref 30.0–36.0)
MCHC: 32.5 g/dL (ref 30.0–36.0)
MCV: 75.3 fL — ABNORMAL LOW (ref 80.0–100.0)
MCV: 75.1 fL — ABNORMAL LOW (ref 80.0–100.0)
MCV: 74.8 fL — ABNORMAL LOW (ref 80.0–100.0)
Platelets: 302 10*3/uL (ref 150–400)
Platelets: 312 10*3/uL (ref 150–400)
Platelets: 278 10*3/uL (ref 150–400)
RBC: 5.18 MIL/uL — ABNORMAL HIGH (ref 3.87–5.11)
RBC: 5.22 MIL/uL — ABNORMAL HIGH (ref 3.87–5.11)
RBC: 4.65 MIL/uL (ref 3.87–5.11)
RDW: 14.8 % (ref 11.5–15.5)
RDW: 14.7 % (ref 11.5–15.5)
RDW: 14.7 % (ref 11.5–15.5)
WBC: 13.2 10*3/uL — ABNORMAL HIGH (ref 4.0–10.5)
WBC: 15.2 10*3/uL — ABNORMAL HIGH (ref 4.0–10.5)
WBC: 15.9 10*3/uL — ABNORMAL HIGH (ref 4.0–10.5)
nRBC: 0 % (ref 0.0–0.2)
nRBC: 0 % (ref 0.0–0.2)
nRBC: 0 % (ref 0.0–0.2)

## 2020-06-29 LAB — COMPREHENSIVE METABOLIC PANEL
ALT: 22 U/L (ref 0–44)
AST: 16 U/L (ref 15–41)
Albumin: 2.7 g/dL — ABNORMAL LOW (ref 3.5–5.0)
Alkaline Phosphatase: 428 U/L — ABNORMAL HIGH (ref 38–126)
Anion gap: 10 (ref 5–15)
BUN: 10 mg/dL (ref 6–20)
CO2: 21 mmol/L — ABNORMAL LOW (ref 22–32)
Calcium: 9.5 mg/dL (ref 8.9–10.3)
Chloride: 104 mmol/L (ref 98–111)
Creatinine, Ser: 0.52 mg/dL (ref 0.44–1.00)
GFR, Estimated: >60 mL/min (ref >60)
Glucose, Bld: 92 mg/dL (ref 70–99)
Potassium: 4.2 mmol/L (ref 3.5–5.1)
Sodium: 135 mmol/L (ref 135–145)
Total Bilirubin: 0.6 mg/dL (ref 0.3–1.2)
Total Protein: 6.1 g/dL — ABNORMAL LOW (ref 6.5–8.1)

## 2020-06-29 LAB — TYPE AND SCREEN
ABO/RH(D): O POS
Antibody Screen: NEGATIVE

## 2020-06-29 LAB — RESP PANEL BY RT-PCR (FLU A&B, COVID) ARPGX2
Influenza A by PCR: NEGATIVE
Influenza B by PCR: NEGATIVE
SARS Coronavirus 2 by RT PCR: NEGATIVE

## 2020-06-29 LAB — GLUCOSE, CAPILLARY
Glucose-Capillary: 99 mg/dL (ref 70–99)
Glucose-Capillary: 113 mg/dL — ABNORMAL HIGH (ref 70–99)
Glucose-Capillary: 94 mg/dL (ref 70–99)

## 2020-06-29 LAB — PROTEIN / CREATININE RATIO, URINE
Creatinine, Urine: 31.78 mg/dL
Protein Creatinine Ratio: 0.22 mg/mg{Cre} — ABNORMAL HIGH (ref 0.00–0.15)
Total Protein, Urine: 7 mg/dL

## 2020-06-29 LAB — RPR: RPR Ser Ql: NONREACTIVE

## 2020-06-29 SURGERY — Surgical Case
Anesthesia: Monitor Anesthesia Care

## 2020-06-29 MED ORDER — DIPHENHYDRAMINE HCL 25 MG PO CAPS: 25.0000 mg | ORAL_CAPSULE | Freq: Four times a day (QID) | ORAL | Status: DC | PRN

## 2020-06-29 MED ORDER — WITCH HAZEL-GLYCERIN EX PADS: 1.0000 "application " | MEDICATED_PAD | CUTANEOUS | Status: DC | PRN

## 2020-06-29 MED ORDER — LIDOCAINE HCL (PF) 1 % IJ SOLN: 30.0000 mL | INTRAMUSCULAR | Status: DC | PRN

## 2020-06-29 MED ORDER — PENICILLIN G POT IN DEXTROSE 60000 UNIT/ML IV SOLN: 3.0000 10*6.[IU] | INTRAVENOUS | Status: DC

## 2020-06-29 MED ORDER — TERBUTALINE SULFATE 1 MG/ML IJ SOLN: 0.2500 mg | Freq: Once | INTRAMUSCULAR | Status: DC | PRN

## 2020-06-29 MED ORDER — SODIUM CHLORIDE 0.9 % IV SOLN
1.0000 g | INTRAVENOUS | Status: DC
  Administered 2020-06-29 (×3): 1 g via INTRAVENOUS
  Filled 2020-06-29 (×6): qty 1000

## 2020-06-29 MED ORDER — SOD CITRATE-CITRIC ACID 500-334 MG/5ML PO SOLN
30.0000 mL | ORAL | Status: DC | PRN
  Administered 2020-06-29: 30 mL via ORAL
  Filled 2020-06-29: qty 15

## 2020-06-29 MED ORDER — OXYCODONE-ACETAMINOPHEN 5-325 MG PO TABS: 2.0000 | ORAL_TABLET | ORAL | Status: DC | PRN

## 2020-06-29 MED ORDER — ONDANSETRON HCL 4 MG/2ML IJ SOLN: 4.0000 mg | INTRAMUSCULAR | Status: DC | PRN

## 2020-06-29 MED ORDER — LACTATED RINGERS IV SOLN: 500.0000 mL | INTRAVENOUS | Status: DC | PRN

## 2020-06-29 MED ORDER — ZOLPIDEM TARTRATE 5 MG PO TABS: 5.0000 mg | ORAL_TABLET | Freq: Every evening | ORAL | Status: DC | PRN

## 2020-06-29 MED ORDER — BENZOCAINE-MENTHOL 20-0.5 % EX AERO
1.0000 "application " | INHALATION_SPRAY | CUTANEOUS | Status: DC | PRN
  Administered 2020-06-30: 1 via TOPICAL
  Filled 2020-06-29: qty 56

## 2020-06-29 MED ORDER — PRENATAL MULTIVITAMIN CH
1.0000 | ORAL_TABLET | Freq: Every day | ORAL | Status: DC
  Administered 2020-06-30 – 2020-07-01 (×2): 1 via ORAL
  Filled 2020-06-29 (×2): qty 1

## 2020-06-29 MED ORDER — DIPHENHYDRAMINE HCL 50 MG/ML IJ SOLN: 12.5000 mg | INTRAMUSCULAR | Status: DC | PRN

## 2020-06-29 MED ORDER — LACTATED RINGERS IV SOLN
500.0000 mL | Freq: Once | INTRAVENOUS | Status: AC
  Administered 2020-06-29: 500 mL via INTRAVENOUS

## 2020-06-29 MED ORDER — PHENYLEPHRINE 40 MCG/ML (10ML) SYRINGE FOR IV PUSH (FOR BLOOD PRESSURE SUPPORT): 80.0000 ug | PREFILLED_SYRINGE | INTRAVENOUS | Status: DC | PRN

## 2020-06-29 MED ORDER — EPHEDRINE 5 MG/ML INJ: 10.0000 mg | INTRAVENOUS | Status: DC | PRN

## 2020-06-29 MED ORDER — LIDOCAINE HCL (PF) 1 % IJ SOLN
INTRAMUSCULAR | Status: DC | PRN
  Administered 2020-06-29: 10 mL

## 2020-06-29 MED ORDER — OXYTOCIN BOLUS FROM INFUSION: 333.0000 mL | Freq: Once | INTRAVENOUS | Status: DC

## 2020-06-29 MED ORDER — IBUPROFEN 600 MG PO TABS
600.0000 mg | ORAL_TABLET | Freq: Four times a day (QID) | ORAL | Status: DC
  Administered 2020-06-30 – 2020-07-01 (×7): 600 mg via ORAL
  Filled 2020-06-29 (×7): qty 1

## 2020-06-29 MED ORDER — SENNOSIDES-DOCUSATE SODIUM 8.6-50 MG PO TABS
2.0000 | ORAL_TABLET | Freq: Every day | ORAL | Status: DC
  Administered 2020-06-30 – 2020-07-01 (×2): 2 via ORAL
  Filled 2020-06-29 (×2): qty 2

## 2020-06-29 MED ORDER — ACETAMINOPHEN 325 MG PO TABS: 650.0000 mg | ORAL_TABLET | ORAL | Status: DC | PRN

## 2020-06-29 MED ORDER — LACTATED RINGERS IV SOLN: INTRAVENOUS | Status: DC

## 2020-06-29 MED ORDER — COCONUT OIL OIL: 1.0000 "application " | TOPICAL_OIL | Status: DC | PRN

## 2020-06-29 MED ORDER — PHENYLEPHRINE 40 MCG/ML (10ML) SYRINGE FOR IV PUSH (FOR BLOOD PRESSURE SUPPORT)
80.0000 ug | PREFILLED_SYRINGE | INTRAVENOUS | Status: DC | PRN
  Filled 2020-06-29: qty 10

## 2020-06-29 MED ORDER — TETANUS-DIPHTH-ACELL PERTUSSIS 5-2.5-18.5 LF-MCG/0.5 IM SUSY: 0.5000 mL | PREFILLED_SYRINGE | Freq: Once | INTRAMUSCULAR | Status: DC

## 2020-06-29 MED ORDER — SODIUM CHLORIDE 0.9 % IV SOLN
2.0000 g | Freq: Once | INTRAVENOUS | Status: AC
  Administered 2020-06-29: 2 g via INTRAVENOUS
  Filled 2020-06-29: qty 2000

## 2020-06-29 MED ORDER — SIMETHICONE 80 MG PO CHEW: 80.0000 mg | CHEWABLE_TABLET | ORAL | Status: DC | PRN

## 2020-06-29 MED ORDER — DIBUCAINE (PERIANAL) 1 % EX OINT: 1.0000 "application " | TOPICAL_OINTMENT | CUTANEOUS | Status: DC | PRN

## 2020-06-29 MED ORDER — OXYTOCIN-SODIUM CHLORIDE 30-0.9 UT/500ML-% IV SOLN
2.5000 [IU]/h | INTRAVENOUS | Status: DC
  Filled 2020-06-29: qty 500

## 2020-06-29 MED ORDER — OXYCODONE-ACETAMINOPHEN 5-325 MG PO TABS: 1.0000 | ORAL_TABLET | ORAL | Status: DC | PRN

## 2020-06-29 MED ORDER — SODIUM CHLORIDE 0.9 % IV SOLN: 5.0000 10*6.[IU] | Freq: Once | INTRAVENOUS | Status: DC

## 2020-06-29 MED ORDER — FENTANYL-BUPIVACAINE-NACL 0.5-0.125-0.9 MG/250ML-% EP SOLN
12.0000 mL/h | EPIDURAL | Status: DC | PRN
  Administered 2020-06-29: 12 mL/h via EPIDURAL
  Filled 2020-06-29: qty 250

## 2020-06-29 MED ORDER — FENTANYL CITRATE (PF) 100 MCG/2ML IJ SOLN: 100.0000 ug | INTRAMUSCULAR | Status: DC | PRN

## 2020-06-29 MED ORDER — OXYTOCIN-SODIUM CHLORIDE 30-0.9 UT/500ML-% IV SOLN
1.0000 m[IU]/min | INTRAVENOUS | Status: DC
  Administered 2020-06-29: 2 m[IU]/min via INTRAVENOUS

## 2020-06-29 MED ORDER — ONDANSETRON HCL 4 MG PO TABS: 4.0000 mg | ORAL_TABLET | ORAL | Status: DC | PRN

## 2020-06-29 MED ORDER — ONDANSETRON HCL 4 MG/2ML IJ SOLN: 4.0000 mg | Freq: Four times a day (QID) | INTRAMUSCULAR | Status: DC | PRN

## 2020-06-29 NOTE — Progress Notes (Signed)
Ashley Durham is a 35 y.o. G1P0 at [redacted]w[redacted]d by ultrasound admitted for preterm labor.  Subjective: Contractions same, some are stronger than others, felt some rectal pressure a few min ago. Using various positions for comfort. Mother Ashley Durham is at [facility] and supporting along with spouse [name].   Objective: Vitals:   06/28/20 2359 06/29/20 0138  BP: 125/80 131/87  Pulse: 99 97  Resp: 20   Temp: 98 F (36.7 C) 98.5 F (36.9 C)  TempSrc: Oral Oral  Weight: 134.7 kg   Height: 5\' 4"  (1.626 m)      FHT:  A- 140, mod var, + accels, no decels, intermittent EFM for maternal habitus B- 140, mod var, + accels, no decels, continuous EFM on POD telemetry UC:   irregular, every 1-4 minutes SVE:   Dilation: 8 Effacement (%): 100 Station: 0 Exam by:: [email or web address], CNM  Labs:   Recent Labs    06/29/20 0115  WBC 13.2*  HGB 12.3  HCT 39.0  PLT 302   CBG (last 3)  Recent Labs    06/29/20 0219 06/29/20 0418 06/29/20 0624  GLUCAP 99 113* 94     Assessment / Plan: G1P0 35 y.o. [redacted]w[redacted]d Mo/Di twins, GDM A1 - stable CBG, will decrease frequency to q 4 hrs checks  Spontaneous labor, progressing normally  Labor: continue expectant management, working w/ position changes Preeclampsia:  labile BP to mild range, will send labs. No neural sx. Fetal Wellbeing:  Category I x 2 Pain Control:  Labor support without medications I/D:  GBS positive, 2nd dose ABX at this time, adequate prophylaxis Anticipated MOD:  NSVD  [redacted]w[redacted]d, CNM, MSN 06/29/2020, 6:23 AM

## 2020-06-29 NOTE — H&P (Addendum)
OB ADMISSION/ HISTORY & PHYSICAL:  Admission Date: 06/28/2020 11:40 PM  Admit Diagnosis: CTX    Kirsta Durham is a 35 y.o. female presenting for painful regular contractions since last evening. Denies LOF or VB, reports good FM x 2. Evaluated in MAU, cvx 5/100/+1, twins vertex/vertex and FHT x 2 reassuring.  Desires unmedicated birth, spouse [name] and patient's mother [name] at [facility] and supportive.  Expecting boy twnis, [name] and [name].   Prenatal History: G1P0   EDC : 08/04/2020, by Other Basis  Prenatal care at [facility] since 1st trim.  Midwife patient co-managed w/ Dr. Billy Coast   Prenatal course complicated by: 1. Mo/Di twins, concordant growth, AGA, no TTTS 2. Cervical incompetence since 23 wks, on vaginal prometrium, BMZ course given at 24 wks and rescue dose at 34 wks 3. GDM A1 well controlled 4. BMI 48 with adequate TWG 5. Hx PCOS 6. Hx infertility 7. GBS positive  Prenatal Labs: ABO, Rh: O (11/22 0000)  Antibody: NEG (04/14 0504) Rubella:   immune RPR: Nonreactive, Nonreactive, Nonreactive (03/03 0000)  HBsAg:   neg HIV:   neg GBS: Positive/-- (02/03 0000)  1 hr Glucola : 149, 3GTT 201 at 1 hr, all other values wnl Genetic Screening: Panorama LR XY identical twins, NT wnl, AFP1 wnl Ultrasound: Mono/Di twins, normal anatomy and normal fetal echo, concordant growths, AGA, posterior placenta Growth 33.6 wks Twin A EFW 4'12" (25%), AFI wnl, Twin B EFW 4'7" (13%), AFI wnl, discordance 6%   Vaccines: TDaP          declined         Flu             declined                    COVID-19 UTD    Maternal Diabetes: Yes:  Diabetes Type:  Diet controlled Genetic Screening: Normal Maternal Ultrasounds/Referrals: Normal Fetal Ultrasounds or other Referrals:  Referred to Materal Fetal Medicine  Maternal Substance Abuse:  No Significant Maternal Medications:  Meds include: Progesterone and BMZ Significant Maternal Lab Results:  Group B Strep  positive Other Comments:  None  Medical / Surgical History :  Past medical history:      Past Medical History:  Diagnosis Date  . Alopecia   . Amenorrhea   . Hypertension   . Menstrual headache   . PCOS (polycystic ovarian syndrome)   . Sleep apnea      Past surgical history:       Past Surgical History:  Procedure Laterality Date  . FOOT SURGERY  2008   extra bone removed from foot  . WISDOM TOOTH EXTRACTION       Family History:       Family History  Problem Relation Age of Onset  . Arthritis Mother   . Cancer Mother   . Miscarriages / [place] Mother   . Obesity Mother   . Cancer Father   . Early death Father   . Obesity Father   . Cancer Maternal Grandfather   . Stroke Maternal Grandfather   . Diabetes Paternal Grandmother   . Heart disease Paternal Grandmother   . Obesity Paternal Grandmother   . Cancer Paternal Grandfather   . Obesity Paternal Grandfather      Social History:  reports that she has never smoked. She has never used smokeless tobacco. She reports previous alcohol use. She reports that she does not use drugs.   Allergies: Patient  has no known allergies.   Current Medications at time of admission:         Medications Prior to Admission  Medication Sig Dispense Refill Last Dose  . acetaminophen (TYLENOL) 500 MG tablet Take 1,000 mg by mouth every 6 (six) hours as needed.     . Ascorbic Acid (VITAMIN C) 1000 MG tablet Take 1,000 mg by mouth daily.     . calcium carbonate (TUMS - DOSED IN MG ELEMENTAL CALCIUM) 500 MG chewable tablet Chew 1-2 tablets by mouth daily as needed for indigestion or heartburn.     . cholecalciferol (VITAMIN D3) 25 MCG (1000 UNIT) tablet Take 1,000 Units by mouth daily.     . Prenatal Vit-Fe Fumarate-FA (PRENATAL MULTIVITAMIN) TABS tablet Take 1 tablet by mouth daily at 12 noon.        Review of Systems: ROS + ctx pain, pelvic pressure, no LOF or VB Denies  HA/NV/RUQ pain/visual changes.    Physical Exam: Vital signs and nursing notes reviewed.  Patient Vitals for the past 24 hrs:  BP Temp Temp src Pulse Resp Height Weight  06/28/20 2359 125/80 98 F (36.7 C) Oral 99 20 5\' 4"  (1.626 m) 134.7 kg     General: AAO x 3, NAD, coping well Heart: RRR Lungs:CTAB Abdomen: Gravid, NT Extremities: +1 pedal edema Genitalia / VE: Dilation: 5 Effacement (%): 90 Station: Plus 1 Presentation: Vertex Exam by:: Dr. [email or web address]   FHR:  Twin A 135 BPM, moderate variability, + accels, no decels Twin B 140 BPM, moderate variability, + accels, no decels TOCO: Ctx q 2-3 min, variable strength  Labs:   Pending T&S, CBC, RPR  Recent Labs (last 2 labs)   No results for input(s): WBC, HGB, HCT, PLT in the last 72 hours.     Assessment:  35 y.o. G1P0 at [redacted]w[redacted]d MoDi twins, AGA Preterm labor, s/p BMZ course and rescue dose GDM A1 well controlled  - check CBG x 3 q 2 hrs, if wnl will check q 4 hrs BMI 48   1. First stage of labor, latent 2. FHR category 1 x 2 3. GBS positive 4. Desires natural labor, open to epidural as needed 5. Breastfeeding 6. Placenta disposal per patient  Plan:  1. Admit to BS 2. Routine L&D orders, GBS prophylaxis Ampicillin 3. Analgesia/anesthesia PRN  4. Expectant management until 2nd dose antibiotics  5. Anticipate NSVB 6. NICU notified of admit and approved    Dr [redacted]w[redacted]d notified of admission / plan of care   Conni Elliot CNM, MSN 06/29/2020, 1:30 AM

## 2020-06-29 NOTE — Anesthesia Procedure Notes (Signed)
Epidural Patient location during procedure: OB Start time: 06/29/2020 1:18 PM End time: 06/29/2020 1:32 PM  Staffing Anesthesiologist: Lucretia Kern, MD Performed: anesthesiologist   Preanesthetic Checklist Completed: patient identified, IV checked, risks and benefits discussed, monitors and equipment checked, pre-op evaluation and timeout performed  Epidural Patient position: sitting Prep: DuraPrep Patient monitoring: heart rate, continuous pulse ox and blood pressure Approach: midline Location: L3-L4 Injection technique: LOR air  Needle:  Needle type: Tuohy  Needle gauge: 17 G Needle length: 9 cm Needle insertion depth: 7 cm Catheter type: closed end flexible Catheter size: 19 Gauge Catheter at skin depth: 12 cm Test dose: negative  Assessment Events: blood not aspirated, injection not painful, no injection resistance, no paresthesia and negative IV test  Additional Notes Reason for block:procedure for pain

## 2020-06-29 NOTE — Anesthesia Preprocedure Evaluation (Addendum)
Anesthesia Evaluation  Patient identified by MRN, date of birth, ID band Patient awake    Reviewed: Allergy & Precautions, H&P , NPO status , Patient's Chart, lab work & pertinent test results  History of Anesthesia Complications Negative for: history of anesthetic complications  Airway Mallampati: II  TM Distance: >3 FB Neck ROM: full    Dental no notable dental hx.    Pulmonary sleep apnea ,    Pulmonary exam normal        Cardiovascular hypertension, negative cardio ROS Normal cardiovascular exam Rhythm:regular Rate:Normal     Neuro/Psych  Headaches, negative psych ROS   GI/Hepatic negative GI ROS, Neg liver ROS,   Endo/Other  Morbid obesityPCOS  Renal/GU      Musculoskeletal   Abdominal   Peds  Hematology negative hematology ROS (+)   Anesthesia Other Findings   Reproductive/Obstetrics (+) Pregnancy                           Anesthesia Physical Anesthesia Plan  ASA: III  Anesthesia Plan: MAC   Post-op Pain Management:    Induction:   PONV Risk Score and Plan: 2 and Treatment may vary due to age or medical condition  Airway Management Planned: Natural Airway  Additional Equipment: None  Intra-op Plan:   Post-operative Plan:   Informed Consent: I have reviewed the patients History and Physical, chart, labs and discussed the procedure including the risks, benefits and alternatives for the proposed anesthesia with the patient or authorized representative who has indicated his/her understanding and acceptance.       Plan Discussed with:   Anesthesia Plan Comments: (Twin delivery in OR, currently without use of epidural for labor analgesia. At this time will plan to monitor only for vaginal delivery in OR, but with possibility of converting to spinal or general anesthesia if needed for C section.)       Anesthesia Quick Evaluation

## 2020-06-29 NOTE — Progress Notes (Signed)
S: breathing w/ ctx, feels occasional pressure in pelvis and pain in pubis with each ctx.   O: VSSAF FHT reassuring x 2, mirroring closely, FHT verified at [facility] w/ sono, both twins 140's baseline  Ctx q 1-3 min, irregular  SVE 10/100/+2  A/P: Second stage FHT cat 1 x 2 Will start pushing Team notified.   Neta Mends, MSN, CNM 06/29/2020, 11:23 AM

## 2020-06-29 NOTE — Lactation Note (Signed)
This note was copied from a baby's chart. Lactation Consultation Note  Patient Name: Nayda Riesen RSWNI'O Date: 06/29/2020 Reason for consult: Multiple gestation;NICU baby;Late-preterm 34-36.6wks;L&D Initial assessment;1st time breastfeeding;Primapara;Maternal endocrine disorder Age:34 hours  Visited with mom of 1 hour old LPI twin males, both babies were transported to the NICU, mom still in L&D when [facility] and [facility] student [employer] arrived.  Twin A Baby "[name]" was taken to the NICU on O2, mom didn't have a chance to hold baby "A".  Twin B Baby "Caileb" was also taken to the NICU, but didn't required O2 during transport. Mom got to hold baby "B" when he was swaddled but didn't get to do STS.  Reviewed hand expression, pumping schedule, supply/demand, lactogenesis II and benefits of breastmilk for NICU infants.   Feeding plan:  1. RN will set up DEBP for mom tonight once she get to her room. No LC coverage tonight 2. Mom aware she needs to start pumping every 3 hours once pump is set up 3. Mom wishes to keep her babies on an exclusive breastmilk diet, even if NICU staff has to use fortifiers, mom's preference is that they're human-milk fortifiers.  GOB (maternal) present and supportive. She voiced she BF all her kids and that her daughter (MOB and FOB) live with her and that she was going to have all the support they have, her goal is for her grandbabies to also be breastfed. FOB in the NICU with infants.   No literature provided due to the nature of this L&D consultation. Family reported all questions and concerns were answered, they're both aware of LC services and will call PRN.   Maternal Data Has patient been taught Hand Expression?: Yes Does the patient have breastfeeding experience prior to this delivery?: No  Feeding Mother's Current Feeding Choice: Breast Milk  Lactation Tools Discussed/Used    Interventions Interventions: Breast feeding basics  reviewed;Education  Discharge Pump: Personal (Spectra and wearable DEBP at home) [facility] Program: No  Consult Status Consult Status: Follow-up Date: 06/30/20 Follow-up type: In-patient    Gerron Guidotti Venetia Constable 06/29/2020, 6:14 PM

## 2020-06-29 NOTE — Progress Notes (Signed)
Pushing with help from CNM, SNM, RN's and family, lots of encouragement and support, used HK and R/L tilt positions. Not tolerating intense pressure and pain with stretch through the perineal floor and stops effort mid push, unable to coach patient through it. Very tired from moving effort and painful to move d/t body habitus.  Recommend epidural now for rest and decreased pelvic pressure, may facilitate passive descent and allow for vaginal delivery.   FHT cat 1 x 2 BP labile to mild range, no neural sx.   CBC stat for anesthesia Prep for epidural now  Dr. Billy Coast updated w/ pt status  Ashley Mends, MSN, CNM 06/29/2020, 12:52 PM

## 2020-06-29 NOTE — Progress Notes (Signed)
  Ashley Durham is a 35 y.o. G1P0 at [redacted]w[redacted]d by ultrasound admitted for Preterm Labor with Mono-Di Twins  Subjective: Contractions are still mild in intensity. Some continue to be stronger than others. Currently not feeling any rectal pressure. Resting in between, contractions.   Objective: Vitals:   06/29/20 0823 06/29/20 0824 06/29/20 0916 06/29/20 0945  BP: (!) 159/95 (!) 147/75 (!) 141/108 (!) 135/92  Pulse: 98 95 (!) 118 (!) 114  Resp:    18  Temp:    98 F (36.7 C)  TempSrc:    Oral  Weight:      Height:          FHT:  Baby A- FHR: 150 bpm, variability: moderate,  accelerations:  Present,  decelerations:  Absent Intermittent; Difficult to trace d/t maternal habitus & position of baby.  Baby B-FHR: 145 bpm, moderate variability, accels presents, decels absent with continous EFM.  UC:   irregular, every 1-3 minutes, mild and moderate to palpation.  SVE:   Dilation: 9 Effacement (%): 100 Station: Plus 1 Exam by:: dPaul,cnm  Labs:   Recent Labs    06/29/20 0115  WBC 13.2*  HGB 12.3  HCT 39.0  PLT 302   CMP     Component Value Date/Time   NA 135 06/29/2020 0635   K 4.2 06/29/2020 0635   CL 104 06/29/2020 0635   CO2 21 (L) 06/29/2020 0635   GLUCOSE 92 06/29/2020 0635   BUN 10 06/29/2020 0635   CREATININE 0.52 06/29/2020 0635   CALCIUM 9.5 06/29/2020 0635   PROT 6.1 (L) 06/29/2020 0635   ALBUMIN 2.7 (L) 06/29/2020 0635   AST 16 06/29/2020 0635   ALT 22 06/29/2020 0635   ALKPHOS 428 (H) 06/29/2020 0635   BILITOT 0.6 06/29/2020 0635   GFRNONAA >60 06/29/2020 0635   CBG (last 3)  Recent Labs    06/29/20 0219 06/29/20 0418 06/29/20 0624  GLUCAP 99 113* 94   Assessment / Plan: G1P0 35 y.o. [redacted]w[redacted]d Mono-Di Twins with slow progression in active labor.  A1 DM-Stable status Continue to monitor q4 hours in active labor.   Labor: Slow progression; Discussed augmentation with AROM for Fetus A. R/B explained to patient and family. Agreeable to plan. AROM of  Fetus A- 06/29/2020 @ 0955 with clear fluid. Continue with expectant management. Will continue to monitor for sx of distress.  Preeclampsia:  BP's contiune to be labile, Labs stable. No HA, BV, epigastric pain. Will Continue to moitor for worsening sx.  Fetal Wellbeing:  Baby A- Cat I;  Baby B Cat: I CNM adjusted placement of EFM & POD for better tracing.  Pain Control:  Pt continues to decline analgesia for pain management. Continues to cope well. I/D:  GBS Positive; Adequately tx with PCN x3  Anticipated MOD:  Baby A-NSVD, Baby B-Cautious for NSVD; Dual Set up in OR for delivery.   Suellen Durocher Danella Deis) Suzie Portela, BSN, RNC-OB  Student Nurse-Midwife   06/29/2020  10:35 AM

## 2020-06-29 NOTE — MAU Provider Note (Signed)
Chief Complaint: Contractions  HPI: Ashley Durham is a 35 y.o. G1P0 at [redacted]w[redacted]d by early ultrasound who presents to maternity admissions reporting contractions. Pt reports that contractions started at 2200 on 06/28/20. She reports good fetal movement, denies LOF, vaginal bleeding, vaginal itching/burning, urinary symptoms, h/a, dizziness, n/v, or fever/chills.  Past Medical History: Past Medical History:  Diagnosis Date  . Alopecia   . Amenorrhea   . Hypertension   . Menstrual headache   . PCOS (polycystic ovarian syndrome)   . Sleep apnea     Past obstetric history: OB History  Gravida Para Term Preterm AB Living  1         0  SAB IAB Ectopic Multiple Live Births               # Outcome Date GA Lbr Len/2nd Weight Sex Delivery Anes PTL Lv  1 Current             Past Surgical History: Past Surgical History:  Procedure Laterality Date  . FOOT SURGERY  2008   extra bone removed from foot  . WISDOM TOOTH EXTRACTION      Family History: Family History  Problem Relation Age of Onset  . Arthritis Mother   . Cancer Mother   . Miscarriages / [place] Mother   . Obesity Mother   . Cancer Father   . Early death Father   . Obesity Father   . Cancer Maternal Grandfather   . Stroke Maternal Grandfather   . Diabetes Paternal Grandmother   . Heart disease Paternal Grandmother   . Obesity Paternal Grandmother   . Cancer Paternal Grandfather   . Obesity Paternal Grandfather     Social History: Social History   Tobacco Use  . Smoking status: Never Smoker  . Smokeless tobacco: Never Used  Vaping Use  . Vaping Use: Never used  Substance Use Topics  . Alcohol use: Not Currently    Comment: 1-2 times per month   . Drug use: No    Allergies: No Known Allergies  Meds:  Medications Prior to Admission  Medication Sig Dispense Refill Last Dose  . acetaminophen (TYLENOL) 500 MG tablet Take 1,000 mg by mouth every 6 (six) hours as needed.     . Ascorbic Acid (VITAMIN C)  1000 MG tablet Take 1,000 mg by mouth daily.     . calcium carbonate (TUMS - DOSED IN MG ELEMENTAL CALCIUM) 500 MG chewable tablet Chew 1-2 tablets by mouth daily as needed for indigestion or heartburn.     . cholecalciferol (VITAMIN D3) 25 MCG (1000 UNIT) tablet Take 1,000 Units by mouth daily.     . Prenatal Vit-Fe Fumarate-FA (PRENATAL MULTIVITAMIN) TABS tablet Take 1 tablet by mouth daily at 12 noon.       ROS:  Review of Systems  Constitutional: Negative for chills and fever.  HENT: Negative for congestion and sore throat.   Respiratory: Negative for cough and shortness of breath.   Cardiovascular: Negative for chest pain.  Gastrointestinal: Negative for abdominal pain, diarrhea, nausea and vomiting.  Genitourinary: Negative for dysuria, vaginal bleeding, vaginal discharge and vaginal pain.  Neurological: Negative for dizziness, light-headedness and headaches.   I have reviewed patient's Past Medical Hx, Surgical Hx, Family Hx, Social Hx, medications and allergies.   Physical Exam   Patient Vitals for the past 24 hrs:  BP Temp Temp src Pulse Resp Height Weight  06/28/20 2359 125/80 98 F (36.7 C) Oral 99 20  (1.626  m) 134.7 kg   Constitutional: Well-developed, well-nourished female in no acute distress. Uncomfortable with contractions. Cardiovascular: normal rate Respiratory: normal effort GI: Abd soft, non-tender, gravid appropriate for gestational age.  MS: Extremities nontender, no edema, normal ROM Neurologic: Alert and oriented x 4.  GU: Neg CVAT.  PELVIC EXAM:  Bimanual exam: Cervix 5/90/+1, cephalic/cephalic on bedside ultrasound  FHT:   Twin A: Baseline 130, moderate variability, accelerations present, no decelerations Twin B: Baseline 130, moderate variability, accelerations present, no decelerations Contractions: q 3-5 mins   Labs: No results found for this or any previous visit (from the past 24 hour(s)). --/--/O POS (04/14 0504)  Imaging:  [place] MFM  FETAL BPP WO NON STRESS  Result Date: 06/22/2020 ----------------------------------------------------------------------  OBSTETRICS REPORT                       (Signed Final 06/22/2020 05:17 pm) ---------------------------------------------------------------------- Patient Info  ID #:       [id number]                          D.O.B.:  16-Feb-1986 (34 yrs)  Name:       Ashley Durham              Visit Date: 06/22/2020 03:37 pm ---------------------------------------------------------------------- Performed By  Attending:        Lin Landsman      Ref. Address:     Therisa Doyne                    MD                                                             & Infertility                                                             [address]                                                             [place], [place]                                                             [address]  Performed By:     Fayne Norrie BS,      Location[facility]                    RD[facility], RVT  [facility]  Referred By:      Olivia Mackie                    MD ---------------------------------------------------------------------- Orders  #  Description                           Code        Ordered By  1  [place] MFM FETAL BPP WO NON               16109.60    RAVI SHANKAR     STRESS  2  [place] MFM OB FOLLOW UP                   76816.01    RAVI SHANKAR  3  [place] MFM FETAL BPP WO NST               76819.1     RAVI SHANKAR     ADDL GESTATION  4  [place] MFM OB FOLLOW UP ADDL              45409.81    RAVI SHANKAR     GEST ----------------------------------------------------------------------  #  Order #                     Accession #                Episode #  1  [id number]                   [id number]                 [id number]  2  [id number]                    [id number]                 [id number]  3  [id number]                   [id number]                 [id number]  4  [id number]                   [id number]                 [id number] ---------------------------------------------------------------------- Indications  Twin pregnancy, mono/di, third trimester       O30.033  Hypertension - Chronic/Pre-existing            O10.019  Obesity complicating pregnancy, third          O99.213  trimester  Antenatal screening for malformations  Z36.3  Low risk NIPS, NT WNL  Family history of genetic disorder (FOB)       Z84.89  [redacted] weeks gestation of pregnancy                Z3A.33 ---------------------------------------------------------------------- Fetal Evaluation (Fetus A)  Num Of Fetuses:         2  Fetal Heart Rate(bpm):  147  Cardiac Activity:       Observed  Fetal Lie:              Maternal right side  Presentation:           Cephalic  Placenta:               Posterior  P. Cord Insertion:      Not well visualized  Membrane Desc:      Dividing Membrane seen - Monochorionic  Amniotic Fluid  AFI FV:      Within normal limits                              Largest Pocket(cm)                              4.35 ---------------------------------------------------------------------- Biophysical Evaluation (Fetus A)  Amniotic F.V:   Pocket => 2 cm             F. Tone:        Observed  F. Movement:    Observed                   Score:          8/8  F. Breathing:   Observed ---------------------------------------------------------------------- Biometry (Fetus A)  BPD:      82.1  mm     G. Age:  33w 0d         23  %    CI:        72.74   %    70 - 86                                                          FL/HC:      20.5   %    19.4 - 21.8  HC:      306.1  mm     G. Age:  34w 1d         20  %    HC/AC:      1.04        0.96 - 1.11  AC:      295.1  mm     G. Age:  33w 3d         42  %    FL/BPD:     76.4   %    71 - 87  FL:       62.7  mm     G. Age:  32w 3d         10  %    FL/AC:       21.2   %    20 - 24  Est. FW:    2144  gm    4 lb 12 oz  25  %     FW Discordancy      0 \ 6 % ---------------------------------------------------------------------- OB History  Gravidity:    1         Term:   0        Prem:   0        SAB:   0  TOP:          0       Ectopic:  0        Living: 0 ---------------------------------------------------------------------- Gestational Age (Fetus A)  LMP:           36w 4d        Date:  10/10/19                 EDD:   07/16/20  U/S Today:     33w 2d                                        EDD:   08/08/20  Best:          33w 6d     Det. By:  Marcella Dubs         EDD:   08/04/20 ---------------------------------------------------------------------- Anatomy (Fetus A)  Cranium:               Appears normal         Kidneys:                Appear normal  Heart:                 Appears normal         Bladder:                Appears normal                         (4CH, axis, and                         situs)  Stomach:               Appears normal, left                         sided  Other:  Technicallly difficult due to advanced GA and maternal habitus and          fetal position. ---------------------------------------------------------------------- Fetal Evaluation (Fetus B)  Num Of Fetuses:         2  Cardiac Activity:       Observed  Fetal Lie:              Maternal left side  Presentation:           Cephalic  Placenta:               Posterior  P. Cord Insertion:      Not well visualized  Membrane Desc:      Dividing Membrane seen - Monochorionic  Amniotic Fluid  AFI FV:      Within normal limits                              Largest Pocket(cm)  3.78 ---------------------------------------------------------------------- Biophysical Evaluation (Fetus B)  Amniotic F.V:   Pocket => 2 cm             F. Tone:        Observed  F. Movement:    Observed                   Score:          6/8  F. Breathing:   Not Observed  ---------------------------------------------------------------------- Biometry (Fetus B)  BPD:      76.5  mm     G. Age:  30w 5d        < 1  %    CI:        67.25   %    70 - 86                                                          FL/HC:      20.7   %    19.4 - 21.8  HC:      298.7  mm     G. Age:  33w 1d          6  %    HC/AC:      1.02        0.96 - 1.11  AC:      292.2  mm     G. Age:  33w 2d         34  %    FL/BPD:     80.7   %    71 - 87  FL:       61.7  mm     G. Age:  32w 0d          5  %    FL/AC:      21.1   %    20 - 24  LV:          4  mm  Est. FW:    2012  gm      4 lb 7 oz     13  %     FW Discordancy         6  % ---------------------------------------------------------------------- Gestational Age (Fetus B)  LMP:           36w 4d        Date:  10/10/19                 EDD:   07/16/20  U/S Today:     32w 2d                                        EDD:   08/15/20  Best:          33w 6d     Det. By:  Marcella Dubs         EDD:   08/04/20 ---------------------------------------------------------------------- Anatomy (Fetus B)  Cranium:               Appears normal         Stomach:                Appears normal, left  sided  Cavum:                 Appears normal         Kidneys:                Appear normal  Ventricles:            Appears normal         Bladder:                Appears normal  Heart:                 Appears normal                         (4CH, axis, and                         situs) ---------------------------------------------------------------------- Cervix Uterus Adnexa  Cervix  Not visualized (advanced GA >24wks) ---------------------------------------------------------------------- Impression  Ashley Durham is here with monochorionic diamniotic twin  pregnancy for growth and TTTS/TAPS screening.  She has known chronic hypertension that is stable 129/88.  Twin A normal stomach, amniotic fluid, bladder and  BPP 8/8-  Cephalic EFW 25th  Twin B normal stomach, amniotic fluid, bladder and BPP 6/8-  Cephalic EFW 13%  Discoradance is at 6%.  I discussed today's visit and recommended continue weekly  testing and repeat growth in 3 weeks.  Given the BPP 6/8 in Twin B I have recommended she go to  MAU for NST.  I personally called MAU  All questions answered. ----------------------------------------------------------------------               Lin Landsman, MD Electronically Signed Final Report   06/22/2020 05:17 pm ----------------------------------------------------------------------  [place] MFM FETAL BPP WO NON STRESS  Result Date: 06/10/2020 ----------------------------------------------------------------------  OBSTETRICS REPORT                       (Signed Final 06/10/2020 01:44 pm) ---------------------------------------------------------------------- Patient Info  ID #:       [id number]                          D.O.B.:  Apr 01, 1985 (34 yrs)  Name:       Ashley Durham              Visit Date: 06/10/2020 12:58 pm ---------------------------------------------------------------------- Performed By  Attending:        Noralee Space MD        Ref. Address:     [facility] seismologist                                                             & Infertility                                                             [address]  [place], [place]                                                             [id number]  Performed By:     Ceasar LundKristin Jackson        Location:         [facility]                                                             [facility]  Referred By:      Olivia MackieICHARD TAAVON                    MD ---------------------------------------------------------------------- Orders  #  Description                            Code        Ordered By  1  US MFM FETAL BPP WO NON               76819.01    RAVI SHANKAR     STRESS  2  US MFM FETAL BPP WO NST               76819.1     RAVI [place]     ADDL GESTATION ----------------------------------------------------------------------  #  Order #                     Accession #                Episode #  1  [id number]                   [id number]  2  [id number]                   [id number]                 [id number] ---------------------------------------------------------------------- Indications  Twin pregnancy, mono/di, third trimester       O30.033  Hypertension - Chronic/Pre-existing            O10.019  Obesity complicating pregnancy, third          O99.213  trimester  Antenatal screening for malformations  Z36.3  Low risk NIPS, NT WNL  Family history of genetic disorder (FOB)       Z84.89  [redacted] weeks gestation of pregnancy                Z3A.32 ---------------------------------------------------------------------- Fetal Evaluation (Fetus A)  Num Of Fetuses:         2  Fetal Heart Rate(bpm):  150  Cardiac Activity:       Observed  Fetal Lie:              Maternal right, lower  Presentation:           Cephalic  Placenta:               Posterior  P. Cord Insertion:      Not well visualized  Membrane Desc:      Dividing Membrane seen  Amniotic Fluid  AFI FV:      Subjectively low-normal                              Largest Pocket(cm)                              2.41  Comment:    2x2cm pocket seen ---------------------------------------------------------------------- Biophysical Evaluation (Fetus A)  Amniotic F.V:   Within normal limits       F. Tone:        Observed  F. Movement:    Observed                   Score:          8/8  F. Breathing:   Observed ---------------------------------------------------------------------- Biometry (Fetus A)  LV:        2.9  mm ---------------------------------------------------------------------- OB History  Gravidity:    1          Term:   0        Prem:   0        SAB:   0  TOP:          0       Ectopic:  0        Living: 0 ---------------------------------------------------------------------- Gestational Age (Fetus A)  LMP:           34w 6d        Date:  10/10/19                 EDD:   07/16/20  Best:          32w 1d     Det. By:  Marcella Dubs         EDD:   08/04/20 ---------------------------------------------------------------------- Anatomy (Fetus A)  Ventricles:            Appears normal         Bladder:                Appears normal  Stomach:               Appears normal, left                         sided  Other:  All other anatomy previously seen. ---------------------------------------------------------------------- Fetal Evaluation (Fetus B)  Num Of Fetuses:         2  Fetal Heart Rate(bpm):  152  Cardiac Activity:  Observed  Fetal Lie:              Maternal left, upper  Presentation:           Cephalic  Placenta:               Posterior  P. Cord Insertion:      Not well visualized  Membrane Desc:      Dividing Membrane seen  Amniotic Fluid  AFI FV:      Subjectively low-normal                              Largest Pocket(cm)                              2.73  Comment:    2x2cm pocket seen ---------------------------------------------------------------------- Biophysical Evaluation (Fetus B)  Amniotic F.V:   Within normal limits       F. Tone:        Observed  F. Movement:    Observed                   Score:          8/8  F. Breathing:   Observed ---------------------------------------------------------------------- Gestational Age (Fetus B)  LMP:           34w 6d        Date:  10/10/19                 EDD:   07/16/20  Best:          32w 1d     Det. By:  Marcella Dubs         EDD:   08/04/20 ---------------------------------------------------------------------- Anatomy (Fetus B)  Stomach:               Appears normal, left   Bladder:                Appears normal                         sided  Kidneys:                Appear normal  Other:  All other anatomy previously seen ---------------------------------------------------------------------- Impression  Monochorionic-diamniotic twin pregnancy.  Patient return to  for antenatal testing.  She had received antenatal corticosteroids at [redacted] weeks  gestation.  Patient reports at your office examination, the  cervix was 3 cm dilated.  She does not have any uterine  contractions.  Twin A: Lower fetus, maternal right, cephalic presentation,  posterior placenta.  Amniotic fluid is low normal and good  fetal activity seen.  Antenatal testing is reassuring.  BPP 8/8.  Twin B: Upper fetus, maternal left, cephalic presentation,  posterior placenta. Amniotic fluid is low normal and good fetal  activity seen.  Antenatal testing is reassuring.  BPP 8/8.  We reassured the patient of the findings.  Patient  understands the increased likelihood of preterm labor and  delivery. ---------------------------------------------------------------------- Recommendations  -Continue weekly BPP till delivery. ----------------------------------------------------------------------                  Noralee Space, MD Electronically Signed Final Report   06/10/2020 01:44 pm ----------------------------------------------------------------------  [place] MFM OB FOLLOW UP  Result Date: 06/22/2020 ----------------------------------------------------------------------  OBSTETRICS REPORT                       (  Signed Final 06/22/2020 05:17 pm) ---------------------------------------------------------------------- Patient Info  ID #:       [id number]                          D.O.B.:  [birth year] (34 yrs)  Name:       Ashley Durham              Visit Date: 06/22/2020 03:37 pm ---------------------------------------------------------------------- Performed By  Attending:        Lin Landsman      Ref. Address:     Therisa Doyne                    MD                                                             &  Infertility                                                             [address]                                                             [place], [place]                                                             [address]  Performed By:     Fayne Norrie BS,      Location:         [facility]                    RDMS, RVT                                [facility]  Referred By:      Olivia Mackie  MD ---------------------------------------------------------------------- Orders  #  Description                           Code        Ordered By  1  [place] MFM FETAL BPP WO NON               76819.01    RAVI SHANKAR     STRESS  2  [place] MFM OB FOLLOW UP                   76816.01    RAVI SHANKAR  3  [place] MFM FETAL BPP WO NST               76819.1     RAVI SHANKAR     ADDL GESTATION  4  [place] MFM OB FOLLOW UP ADDL              16109.60    RAVI SHANKAR     GEST ----------------------------------------------------------------------  #  Order #                     Accession #                Episode #  1  [id number]                   [id number]                 [id number]  2  [id number]                   [id number]  3  [id number]                   [id number]                 [id number]  4  [id number]                   [id number]                 [id number] ---------------------------------------------------------------------- Indications  Twin pregnancy, mono/di, third trimester       O30.033  Hypertension - Chronic/Pre-existing            O10.019  Obesity complicating pregnancy, third          O99.213  trimester  Antenatal screening for malformations          Z36.3  Low risk NIPS, NT WNL  Family history of genetic disorder (FOB)       Z84.89  [redacted] weeks gestation of pregnancy                Z3A.33  ---------------------------------------------------------------------- Fetal Evaluation (Fetus A)  Num Of Fetuses:         2  Fetal Heart Rate(bpm):  147  Cardiac Activity:       Observed  Fetal Lie:              Maternal right side  Presentation:           Cephalic  Placenta:               Posterior  P. Cord Insertion:      Not well visualized  Membrane Desc:      Dividing Membrane seen - Monochorionic  Amniotic Fluid  AFI FV:      Within normal limits                              Largest Pocket(cm)                              4.35 ---------------------------------------------------------------------- Biophysical Evaluation (Fetus A)  Amniotic F.V:   Pocket => 2 cm             F. Tone:        Observed  F. Movement:    Observed                   Score:          8/8  F. Breathing:   Observed ---------------------------------------------------------------------- Biometry (Fetus A)  BPD:      82.1  mm     G. Age:  33w 0d         23  %    CI:        72.74   %    70 - 86                                                          FL/HC:      20.5   %    19.4 - 21.8  HC:      306.1  mm     G. Age:  34w 1d         20  %    HC/AC:      1.04        0.96 - 1.11  AC:      295.1  mm     G. Age:  33w 3d         42  %    FL/BPD:     76.4   %    71 - 87  FL:       62.7  mm     G. Age:  32w 3d         10  %    FL/AC:      21.2   %    20 - 24  Est. FW:    2144  gm    4 lb 12 oz      25  %     FW Discordancy      0 \ 6 % ---------------------------------------------------------------------- OB History  Gravidity:    1         Term:   0        Prem:   0        SAB:   0  TOP:          0       Ectopic:  0        Living: 0 ---------------------------------------------------------------------- Gestational Age (Fetus A)  LMP:           36w 4d        Date:  10/10/19                 EDD:   07/16/20  U/S Today:     33w 2d  EDD:   08/08/20  Best:          33w 6d     Det. By:  Marcella Dubs         EDD:    08/04/20 ---------------------------------------------------------------------- Anatomy (Fetus A)  Cranium:               Appears normal         Kidneys:                Appear normal  Heart:                 Appears normal         Bladder:                Appears normal                         (4CH, axis, and                         situs)  Stomach:               Appears normal, left                         sided  Other:  Technicallly difficult due to advanced GA and maternal habitus and          fetal position. ---------------------------------------------------------------------- Fetal Evaluation (Fetus B)  Num Of Fetuses:         2  Cardiac Activity:       Observed  Fetal Lie:              Maternal left side  Presentation:           Cephalic  Placenta:               Posterior  P. Cord Insertion:      Not well visualized  Membrane Desc:      Dividing Membrane seen - Monochorionic  Amniotic Fluid  AFI FV:      Within normal limits                              Largest Pocket(cm)                              3.78 ---------------------------------------------------------------------- Biophysical Evaluation (Fetus B)  Amniotic F.V:   Pocket => 2 cm             F. Tone:        Observed  F. Movement:    Observed                   Score:          6/8  F. Breathing:   Not Observed ---------------------------------------------------------------------- Biometry (Fetus B)  BPD:      76.5  mm     G. Age:  30w 5d        < 1  %    CI:        67.25   %    70 - 86  FL/HC:      20.7   %    19.4 - 21.8  HC:      298.7  mm     G. Age:  33w 1d          6  %    HC/AC:      1.02        0.96 - 1.11  AC:      292.2  mm     G. Age:  33w 2d         34  %    FL/BPD:     80.7   %    71 - 87  FL:       61.7  mm     G. Age:  32w 0d          5  %    FL/AC:      21.1   %    20 - 24  LV:          4  mm  Est. FW:    2012  gm      4 lb 7 oz     13  %     FW Discordancy         6  %  ---------------------------------------------------------------------- Gestational Age (Fetus B)  LMP:           36w 4d        Date:  10/10/19                 EDD:   07/16/20  U/S Today:     32w 2d                                        EDD:   08/15/20  Best:          33w 6d     Det. By:  Marcella Dubs         EDD:   08/04/20 ---------------------------------------------------------------------- Anatomy (Fetus B)  Cranium:               Appears normal         Stomach:                Appears normal, left                                                                        sided  Cavum:                 Appears normal         Kidneys:                Appear normal  Ventricles:            Appears normal         Bladder:                Appears normal  Heart:                 Appears normal                         (  4CH, axis, and                         situs) ---------------------------------------------------------------------- Cervix Uterus Adnexa  Cervix  Not visualized (advanced GA >24wks) ---------------------------------------------------------------------- Impression  Ashley Durham is here with monochorionic diamniotic twin  pregnancy for growth and TTTS/TAPS screening.  She has known chronic hypertension that is stable 129/88.  Twin A normal stomach, amniotic fluid, bladder and BPP 8/8-  Cephalic EFW 25th  Twin B normal stomach, amniotic fluid, bladder and BPP 6/8-  Cephalic EFW 13%  Discoradance is at 6%.  I discussed today's visit and recommended continue weekly  testing and repeat growth in 3 weeks.  Given the BPP 6/8 in Twin B I have recommended she go to  MAU for NST.  I personally called MAU  All questions answered. ----------------------------------------------------------------------               Lin Landsman, MD Electronically Signed Final Report   06/22/2020 05:17 pm ----------------------------------------------------------------------  [place] MFM OB FOLLOW UP  Result Date:  06/03/2020 ----------------------------------------------------------------------  OBSTETRICS REPORT                       (Signed Final 06/03/2020 02:43 pm) ---------------------------------------------------------------------- Patient Info  ID #:       [id number]                          D.O.B.:  May 26, 1985 (34 yrs)  Name:       Ashley Durham              Visit Date: 06/03/2020 01:00 pm ---------------------------------------------------------------------- Performed By  Attending:        Noralee Space MD        Ref. Address:     [facility]                                                             & Infertility                                                             [address]                                                             [place], [place]                                                             [address]  Performed By:     Reinaldo Raddle            Location:         [facility]                                     [facility]  Referred By:      Olivia Mackie                    MD ---------------------------------------------------------------------- Orders  #  Description                           Code        Ordered By  1  [place] MFM OB FOLLOW UP                   76816.01    RAVI SHANKAR  2  [place] MFM OB FOLLOW UP ADDL              [address].60    RAVI SHANKAR     GEST ----------------------------------------------------------------------  #  Order #                     Accession #                Episode #  1  [id number]                   [id number]                 [id number]  2  [id number]                   [id number]                 [id number] ---------------------------------------------------------------------- Indications  Twin pregnancy, mono/di, second trimester      O30.032  Hypertension - Chronic/Pre-existing             O10.019  Obesity complicating pregnancy, second         O99.212  trimester  Antenatal screening for malformations          Z36.3  Encounter for cervical length                  Z36.86  Low risk NIPS, NT WNL  Family history of genetic disorder (FOB)       Z84.89  [redacted] weeks gestation of pregnancy                Z3A.31 ---------------------------------------------------------------------- Fetal Evaluation (Fetus A)  Num Of Fetuses:         2  Fetal Heart Rate(bpm):  136  Cardiac Activity:       Observed  Fetal Lie:  Lower Fetus; maternal right  Presentation:           Cephalic  Placenta:               Posterior  P. Cord Insertion:      Not well visualized  Amniotic Fluid  AFI FV:      Within normal limits                              Largest Pocket(cm)                              6.28 ---------------------------------------------------------------------- Biometry (Fetus A)  BPD:      72.4  mm     G. Age:  29w 0d        2.2  %    CI:         69.3   %    70 - 86                                                          FL/HC:      19.7   %    19.3 - 21.3  HC:      277.7  mm     G. Age:  30w 3d          5  %    HC/AC:      1.01        0.96 - 1.17  AC:      275.5  mm     G. Age:  31w 4d         61  %    FL/BPD:     75.4   %    71 - 87  FL:       54.6  mm     G. Age:  28w 6d        1.8  %    FL/AC:      19.8   %    20 - 24  Est. FW:    1572  gm      3 lb 7 oz     18  %     FW Discordancy         3  % ---------------------------------------------------------------------- OB History  Gravidity:    1         Term:   0        Prem:   0        SAB:   0  TOP:          0       Ectopic:  0        Living: 0 ---------------------------------------------------------------------- Gestational Age (Fetus A)  LMP:           33w 6d        Date:  10/10/19                 EDD:   07/16/20  U/S Today:     30w 0d  EDD:   08/12/20  Best:          31w 1d     Det. By:  Marcella Dubs         EDD:    08/04/20 ---------------------------------------------------------------------- Anatomy (Fetus A)  Cranium:               Previously seen        LVOT:                   Previously seen  Cavum:                 Previously seen        Aortic Arch:            Previously seen  Ventricles:            Appears normal         Ductal Arch:            Previously seen  Choroid Plexus:        Previously seen        Diaphragm:              Appears normal  Cerebellum:            Previously seen        Stomach:                Appears normal, left                                                                        sided  Posterior Fossa:       Previously seen        Abdomen:                Appears normal  Nuchal Fold:           Not applicable (>20    Abdominal Wall:         Previously seen                         wks GA)  Face:                  Orbits and profile     Cord Vessels:           Previously seen                         previously seen  Lips:                  Previously seen        Kidneys:                Previously seen  Palate:                Previously seen        Bladder:                Appears normal  Thoracic:              Appears normal         Spine:  Previously seen  Heart:                 Appears normal         Upper Extremities:      Previously seen                         (4CH, axis, and                         situs)  RVOT:                  Previously seen        Lower Extremities:      Previously seen  Other:  Fetus appears to be a female. Nasal bone prev visualized. Lenses prev          visualized. Heels/feet prev visualized. Technically difficult due to fetal          position. ---------------------------------------------------------------------- Fetal Evaluation (Fetus B)  Num Of Fetuses:         2  Fetal Heart Rate(bpm):  131  Cardiac Activity:       Observed  Fetal Lie:              Upper Left Fetus  Presentation:           Cephalic  Placenta:               Posterior  P. Cord  Insertion:      Previously Visualized  Amniotic Fluid  AFI FV:      Within normal limits                              Largest Pocket(cm)                              4.14 ---------------------------------------------------------------------- Biometry (Fetus B)  BPD:      73.1  mm     G. Age:  29w 2d        3.7  %    CI:        70.52   %    70 - 86                                                          FL/HC:      19.6   %    19.3 - 21.3  HC:      277.5  mm     G. Age:  30w 2d        4.9  %    HC/AC:      0.99        0.96 - 1.17  AC:      280.9  mm     G. Age:  32w 1d         76  %    FL/BPD:     74.3   %    71 - 87  FL:       54.3  mm     G. Age:  28w 5d        1.4  %    FL/AC:  19.3   %    20 - 24  Est. FW:    1620  gm      3 lb 9 oz     24  %     FW Discordancy      0 \ 3 % ---------------------------------------------------------------------- Gestational Age (Fetus B)  LMP:           33w 6d        Date:  10/10/19                 EDD:   07/16/20  U/S Today:     30w 1d                                        EDD:   08/11/20  Best:          31w 1d     Det. By:  Marcella Dubs         EDD:   08/04/20 ---------------------------------------------------------------------- Anatomy (Fetus B)  Cranium:               Appears normal         LVOT:                   Previously seen  Cavum:                 Appears normal         Aortic Arch:            Previously seen  Ventricles:            Appears normal         Ductal Arch:            Previously seen  Choroid Plexus:        Previously seen        Diaphragm:              Appears normal  Cerebellum:            Previously seen        Stomach:                Appears normal, left                                                                        sided  Posterior Fossa:       Previously seen        Abdomen:                Appears normal  Nuchal Fold:           Not applicable (>20    Abdominal Wall:         Previously seen                         wks GA)  Face:                   Orbits and profile     Cord Vessels:           Previously seen  previously seen  Lips:                  Previously seen        Kidneys:                Appear normal  Palate:                Previously seen        Bladder:                Appears normal  Thoracic:              Previously seen        Spine:                  Previously seen  Heart:                 Previously seen        Upper Extremities:      Previously seen  RVOT:                  Previously seen        Lower Extremities:      Previously seen  Other:  Female gender previously seen.Nasal bone and Lenses prev.          visualized. Left heel  prev visualized. RT hand and feet visualized          prev. Technically difficult due to fetal position. ---------------------------------------------------------------------- Cervix Uterus Adnexa  Cervix  Not visualized (advanced GA >24wks) ---------------------------------------------------------------------- Impression  Monochorionic-diamniotic twin pregnancy.  Patient returned for fetal growth assessments.  Twin A: Lower fetus, maternal right, cephalic presentation,  female fetus.  Amniotic fluid is normal and good fetal activity  seen.  Fetal growth is appropriate for gestational age.  Fetal  bladder is seen well.  Twin B: Upper fetus, maternal left, cephalic presentation,  female fetus. Amniotic fluid is normal and good fetal activity  seen.  Fetal growth is appropriate for gestational age.  Fetal  bladder is seen well.  Growth discordancy: 3% (normal).  No evidence of twin-twin-transfusion syndrome.  We reassured the patient of the findings. ---------------------------------------------------------------------- Recommendations  -Appointments were made for weekly antenatal testing (BPP)  till delivery. ----------------------------------------------------------------------                  Noralee Space, MD Electronically Signed Final Report   06/03/2020 02:43 pm  ----------------------------------------------------------------------  [place] MFM OB FOLLOW UP ADDL GEST  Result Date: 06/22/2020 ----------------------------------------------------------------------  OBSTETRICS REPORT                       (Signed Final 06/22/2020 05:17 pm) ---------------------------------------------------------------------- Patient Info  ID #:       [id number]                          D.O.B.:  Jun 10, 1985 (34 yrs)  Name:       Ashley Durham              Visit Date: 06/22/2020 03:37 pm ---------------------------------------------------------------------- Performed By  Attending:        Lin Landsman      Ref. Address:     [facility]                    MD                                                             &  Infertility                                                             [address]                                                             [place], [place]                                                             [address]  Performed By:     Fayne Norrie BS,      Location:         [facility]                                [facility]  Referred By:      Olivia Mackie                    MD ---------------------------------------------------------------------- Orders  #  Description                           Code        Ordered By  1  [place] MFM FETAL BPP WO NON               76819.01    RAVI SHANKAR     STRESS  2  [place] MFM OB FOLLOW UP                   76816.01    RAVI SHANKAR  3  [place] MFM FETAL BPP WO NST               60454.0     RAVI SHANKAR     ADDL GESTATION  4  [place] MFM OB FOLLOW UP ADDL              98119.14    RAVI SHANKAR     GEST ----------------------------------------------------------------------  #  Order #                     Accession #                Episode #  1  [id number]                    [id number]                 [id number]  2  [id number]                   [id number]                 [id number]  3  [id number]                   [id number]                 [id number]  4  [id number]                   [id number]                 [id number] ---------------------------------------------------------------------- Indications  Twin pregnancy, mono/di, third trimester       O30.033  Hypertension - Chronic/Pre-existing            O10.019  Obesity complicating pregnancy, third          O99.213  trimester  Antenatal screening for malformations          Z36.3  Low risk NIPS, NT WNL  Family history of genetic disorder (FOB)       Z84.89  [redacted] weeks gestation of pregnancy                Z3A.33 ---------------------------------------------------------------------- Fetal Evaluation (Fetus A)  Num Of Fetuses:         2  Fetal Heart Rate(bpm):  147  Cardiac Activity:       Observed  Fetal Lie:              Maternal right side  Presentation:           Cephalic  Placenta:               Posterior  P. Cord Insertion:      Not well visualized  Membrane Desc:      Dividing Membrane seen - Monochorionic  Amniotic Fluid  AFI FV:      Within normal limits                              Largest Pocket(cm)                              4.35 ---------------------------------------------------------------------- Biophysical Evaluation (Fetus A)  Amniotic F.V:   Pocket => 2 cm             F. Tone:        Observed  F. Movement:    Observed                   Score:          8/8  F. Breathing:   Observed ---------------------------------------------------------------------- Biometry (Fetus A)  BPD:      82.1  mm     G. Age:  33w 0d         23  %  CI:        72.74   %    70 - 86                                                          FL/HC:      20.5   %    19.4 - 21.8  HC:      306.1  mm     G. Age:  34w 1d         20  %    HC/AC:      1.04        0.96 - 1.11  AC:      295.1  mm     G. Age:  33w 3d         42  %    FL/BPD:     76.4   %     71 - 87  FL:       62.7  mm     G. Age:  32w 3d         10  %    FL/AC:      21.2   %    20 - 24  Est. FW:    2144  gm    4 lb 12 oz      25  %     FW Discordancy      0 \ 6 % ---------------------------------------------------------------------- OB History  Gravidity:    1         Term:   0        Prem:   0        SAB:   0  TOP:          0       Ectopic:  0        Living: 0 ---------------------------------------------------------------------- Gestational Age (Fetus A)  LMP:           36w 4d        Date:  10/10/19                 EDD:   07/16/20  U/S Today:     33w 2d                                        EDD:   08/08/20  Best:          33w 6d     Det. By:  Marcella Dubs         EDD:   08/04/20 ---------------------------------------------------------------------- Anatomy (Fetus A)  Cranium:               Appears normal         Kidneys:                Appear normal  Heart:                 Appears normal         Bladder:                Appears normal                         (  4CH, axis, and                         situs)  Stomach:               Appears normal, left                         sided  Other:  Technicallly difficult due to advanced GA and maternal habitus and          fetal position. ---------------------------------------------------------------------- Fetal Evaluation (Fetus B)  Num Of Fetuses:         2  Cardiac Activity:       Observed  Fetal Lie:              Maternal left side  Presentation:           Cephalic  Placenta:               Posterior  P. Cord Insertion:      Not well visualized  Membrane Desc:      Dividing Membrane seen - Monochorionic  Amniotic Fluid  AFI FV:      Within normal limits                              Largest Pocket(cm)                              3.78 ---------------------------------------------------------------------- Biophysical Evaluation (Fetus B)  Amniotic F.V:   Pocket => 2 cm             F. Tone:        Observed  F. Movement:    Observed                   Score:           6/8  F. Breathing:   Not Observed ---------------------------------------------------------------------- Biometry (Fetus B)  BPD:      76.5  mm     G. Age:  30w 5d        < 1  %    CI:        67.25   %    70 - 86                                                          FL/HC:      20.7   %    19.4 - 21.8  HC:      298.7  mm     G. Age:  33w 1d          6  %    HC/AC:      1.02        0.96 - 1.11  AC:      292.2  mm     G. Age:  33w 2d         34  %    FL/BPD:     80.7   %    71 - 87  FL:       61.7  mm     G. Age:  32w 71d  5  %    FL/AC:      21.1   %    20 - 24  LV:          4  mm  Est. FW:    2012  gm      4 lb 7 oz     13  %     FW Discordancy         6  % ---------------------------------------------------------------------- Gestational Age (Fetus B)  LMP:           36w 4d        Date:  10/10/19                 EDD:   07/16/20  U/S Today:     32w 2d                                        EDD:   08/15/20  Best:          33w 6d     Det. By:  Marcella Dubs         EDD:   08/04/20 ---------------------------------------------------------------------- Anatomy (Fetus B)  Cranium:               Appears normal         Stomach:                Appears normal, left                                                                        sided  Cavum:                 Appears normal         Kidneys:                Appear normal  Ventricles:            Appears normal         Bladder:                Appears normal  Heart:                 Appears normal                         (4CH, axis, and                         situs) ---------------------------------------------------------------------- Cervix Uterus Adnexa  Cervix  Not visualized (advanced GA >24wks) ---------------------------------------------------------------------- Impression  Ashley Durham is here with monochorionic diamniotic twin  pregnancy for growth and TTTS/TAPS screening.  She has known chronic hypertension that is stable 129/88.  Twin A  normal stomach, amniotic fluid, bladder and BPP 8/8-  Cephalic EFW 25th  Twin B normal stomach, amniotic fluid, bladder and BPP 6/8-  Cephalic EFW 13%  Discoradance is at 6%.  I discussed today's visit and recommended continue weekly  testing and repeat growth in 3 weeks.  Given the BPP 6/8 in Twin B I have recommended she go  to  MAU for NST.  I personally called MAU  All questions answered. ----------------------------------------------------------------------               Lin Landsman, MD Electronically Signed Final Report   06/22/2020 05:17 pm ----------------------------------------------------------------------  [place] MFM OB FOLLOW UP ADDL GEST  Result Date: 06/03/2020 ----------------------------------------------------------------------  OBSTETRICS REPORT                       (Signed Final 06/03/2020 02:43 pm) ---------------------------------------------------------------------- Patient Info  ID #:       [id number]                          D.O.B.:  [birth year] (34 yrs)  Name:       Ashley Durham              Visit Date: 06/03/2020 01:00 pm ---------------------------------------------------------------------- Performed By  Attending:        Noralee Space MD        Ref. Address:     [facility]                                                             & Infertility                                                             [address]                                                             [place], [place]                                                             [address]  Performed By:     Reinaldo Raddle            Location:         [facility]                                     [facility]                                                             [facility]  Referred By:      Olivia Mackie                    MD ----------------------------------------------------------------------  Orders  #  Description                           Code        Ordered By  1  [place] MFM OB FOLLOW UP                   [phone]    RAVI SHANKAR  2  [place] MFM OB FOLLOW UP ADDL              [id number]    RAVI SHANKAR     GEST ----------------------------------------------------------------------  #  Order #                     Accession #                Episode #  1  [id number]                   [id number]                 [id number]  2  [id number]                   [id number]                 [id number] ---------------------------------------------------------------------- Indications  Twin pregnancy, mono/di, second trimester      O30.032  Hypertension - Chronic/Pre-existing            O10.019  Obesity complicating pregnancy, second         O99.212  trimester  Antenatal screening for malformations          Z36.3  Encounter for cervical length                  Z36.86  Low risk NIPS, NT WNL  Family history of genetic disorder (FOB)       Z84.89  [redacted] weeks gestation of pregnancy                Z3A.31 ---------------------------------------------------------------------- Fetal Evaluation (Fetus A)  Num Of Fetuses:         2  Fetal Heart Rate(bpm):  136  Cardiac Activity:       Observed  Fetal Lie:              Lower Fetus; maternal right  Presentation:           Cephalic  Placenta:               Posterior  P. Cord Insertion:      Not well visualized  Amniotic Fluid  AFI FV:      Within normal limits                              Largest Pocket(cm)                              6.28 ---------------------------------------------------------------------- Biometry (Fetus A)  BPD:      72.4  mm     G. Age:  29w 0d        2.2  %    CI:  69.3   %    70 - 86                                                          FL/HC:      19.7   %    19.3 - 21.3  HC:      277.7  mm     G. Age:  30w 3d          5  %    HC/AC:      1.01        0.96 - 1.17  AC:      275.5  mm     G. Age:  31w 4d         61  %    FL/BPD:     75.4   %    71 - 87  FL:        54.6  mm     G. Age:  28w 6d        1.8  %    FL/AC:      19.8   %    20 - 24  Est. FW:    1572  gm      3 lb 7 oz     18  %     FW Discordancy         3  % ---------------------------------------------------------------------- OB History  Gravidity:    1         Term:   0        Prem:   0        SAB:   0  TOP:          0       Ectopic:  0        Living: 0 ---------------------------------------------------------------------- Gestational Age (Fetus A)  LMP:           33w 6d        Date:  10/10/19                 EDD:   07/16/20  U/S Today:     30w 0d                                        EDD:   08/12/20  Best:          31w 1d     Det. By:  Marcella Dubs         EDD:   08/04/20 ---------------------------------------------------------------------- Anatomy (Fetus A)  Cranium:               Previously seen        LVOT:                   Previously seen  Cavum:                 Previously seen        Aortic Arch:            Previously seen  Ventricles:            Appears normal         Ductal Arch:  Previously seen  Choroid Plexus:        Previously seen        Diaphragm:              Appears normal  Cerebellum:            Previously seen        Stomach:                Appears normal, left                                                                        sided  Posterior Fossa:       Previously seen        Abdomen:                Appears normal  Nuchal Fold:           Not applicable (>20    Abdominal Wall:         Previously seen                         wks GA)  Face:                  Orbits and profile     Cord Vessels:           Previously seen                         previously seen  Lips:                  Previously seen        Kidneys:                Previously seen  Palate:                Previously seen        Bladder:                Appears normal  Thoracic:              Appears normal         Spine:                  Previously seen  Heart:                 Appears normal         Upper  Extremities:      Previously seen                         (4CH, axis, and                         situs)  RVOT:                  Previously seen        Lower Extremities:      Previously seen  Other:  Fetus appears to be a female. Nasal bone prev visualized. Lenses prev          visualized. Heels/feet prev visualized. Technically  difficult due to fetal          position. ---------------------------------------------------------------------- Fetal Evaluation (Fetus B)  Num Of Fetuses:         2  Fetal Heart Rate(bpm):  131  Cardiac Activity:       Observed  Fetal Lie:              Upper Left Fetus  Presentation:           Cephalic  Placenta:               Posterior  P. Cord Insertion:      Previously Visualized  Amniotic Fluid  AFI FV:      Within normal limits                              Largest Pocket(cm)                              4.14 ---------------------------------------------------------------------- Biometry (Fetus B)  BPD:      73.1  mm     G. Age:  29w 2d        3.7  %    CI:        70.52   %    70 - 86                                                          FL/HC:      19.6   %    19.3 - 21.3  HC:      277.5  mm     G. Age:  30w 2d        4.9  %    HC/AC:      0.99        0.96 - 1.17  AC:      280.9  mm     G. Age:  32w 1d         76  %    FL/BPD:     74.3   %    71 - 87  FL:       54.3  mm     G. Age:  28w 5d        1.4  %    FL/AC:      19.3   %    20 - 24  Est. FW:    1620  gm      3 lb 9 oz     24  %     FW Discordancy      0 \ 3 % ---------------------------------------------------------------------- Gestational Age (Fetus B)  LMP:           33w 6d        Date:  10/10/19                 EDD:   07/16/20  U/S Today:     30w 1d                                        EDD:   08/11/20  Best:  31w 1d     Det. By:  Marcella Dubs         EDD:   08/04/20 ---------------------------------------------------------------------- Anatomy (Fetus B)  Cranium:               Appears normal         LVOT:                    Previously seen  Cavum:                 Appears normal         Aortic Arch:            Previously seen  Ventricles:            Appears normal         Ductal Arch:            Previously seen  Choroid Plexus:        Previously seen        Diaphragm:              Appears normal  Cerebellum:            Previously seen        Stomach:                Appears normal, left                                                                        sided  Posterior Fossa:       Previously seen        Abdomen:                Appears normal  Nuchal Fold:           Not applicable (>20    Abdominal Wall:         Previously seen                         wks GA)  Face:                  Orbits and profile     Cord Vessels:           Previously seen                         previously seen  Lips:                  Previously seen        Kidneys:                Appear normal  Palate:                Previously seen        Bladder:                Appears normal  Thoracic:              Previously seen        Spine:                  Previously seen  Heart:  Previously seen        Upper Extremities:      Previously seen  RVOT:                  Previously seen        Lower Extremities:      Previously seen  Other:  Female gender previously seen.Nasal bone and Lenses prev.          visualized. Left heel  prev visualized. RT hand and feet visualized          prev. Technically difficult due to fetal position. ---------------------------------------------------------------------- Cervix Uterus Adnexa  Cervix  Not visualized (advanced GA >24wks) ---------------------------------------------------------------------- Impression  Monochorionic-diamniotic twin pregnancy.  Patient returned for fetal growth assessments.  Twin A: Lower fetus, maternal right, cephalic presentation,  female fetus.  Amniotic fluid is normal and good fetal activity  seen.  Fetal growth is appropriate for gestational age.  Fetal  bladder is seen well.   Twin B: Upper fetus, maternal left, cephalic presentation,  female fetus. Amniotic fluid is normal and good fetal activity  seen.  Fetal growth is appropriate for gestational age.  Fetal  bladder is seen well.  Growth discordancy: 3% (normal).  No evidence of twin-twin-transfusion syndrome.  We reassured the patient of the findings. ---------------------------------------------------------------------- Recommendations  -Appointments were made for weekly antenatal testing (BPP)  till delivery. ----------------------------------------------------------------------                  Noralee Space, MD Electronically Signed Final Report   06/03/2020 02:43 pm ----------------------------------------------------------------------  [place] MFM FETAL BPP WO NST ADDL GESTATION  Result Date: 06/22/2020 ----------------------------------------------------------------------  OBSTETRICS REPORT                       (Signed Final 06/22/2020 05:17 pm) ---------------------------------------------------------------------- Patient Info  ID #:       [id number]                          D.O.B.:  [birth year] (34 yrs)  Name:       Ashley Durham              Visit Date: 06/22/2020 03:37 pm ---------------------------------------------------------------------- Performed By  Attending:        Lin Landsman      Ref. Address:     [facility]                    MD                                                             [facility]                                                             [address]  [place], [place]                                                             [address]  Performed By:     Fayne Norrie BS,      Location[facility], [facility]                                [facility]   Referred By:      Olivia Mackie                    MD ---------------------------------------------------------------------- Orders  #  Description                           Code        Ordered By  1  [place] MFM FETAL BPP WO NON               76819.01    RAVI SHANKAR     STRESS  2  [place] MFM OB FOLLOW UP                   76816.01    RAVI SHANKAR  3  [place] MFM FETAL BPP WO NST               76819.1     RAVI SHANKAR     ADDL GESTATION  4  [place] MFM OB FOLLOW UP ADDL              60454.09    RAVI SHANKAR     GEST ----------------------------------------------------------------------  #  Order #                     Accession #                Episode #  1  [id number]                   [id number]                 [id number]  2  [id number]                   [id number]  [id number]  3  [id number]                   [id number]                 [id number]  4  [id number]                   [id number]                 [id number] ---------------------------------------------------------------------- Indications  Twin pregnancy, mono/di, third trimester       O30.033  Hypertension - Chronic/Pre-existing            O10.019  Obesity complicating pregnancy, third          O99.213  trimester  Antenatal screening for malformations          Z36.3  Low risk NIPS, NT WNL  Family history of genetic disorder (FOB)       Z84.89  [redacted] weeks gestation of pregnancy                Z3A.33 ---------------------------------------------------------------------- Fetal Evaluation (Fetus A)  Num Of Fetuses:         2  Fetal Heart Rate(bpm):  147  Cardiac Activity:       Observed  Fetal Lie:              Maternal right side  Presentation:           Cephalic  Placenta:               Posterior  P. Cord Insertion:      Not well visualized  Membrane Desc:      Dividing Membrane seen - Monochorionic  Amniotic Fluid  AFI FV:      Within normal limits                              Largest Pocket(cm)                              4.35  ---------------------------------------------------------------------- Biophysical Evaluation (Fetus A)  Amniotic F.V:   Pocket => 2 cm             F. Tone:        Observed  F. Movement:    Observed                   Score:          8/8  F. Breathing:   Observed ---------------------------------------------------------------------- Biometry (Fetus A)  BPD:      82.1  mm     G. Age:  33w 0d         23  %    CI:        72.74   %    70 - 86                                                          FL/HC:      20.5   %    19.4 - 21.8  HC:      306.1  mm     G. Age:  34w 1d  20  %    HC/AC:      1.04        0.96 - 1.11  AC:      295.1  mm     G. Age:  33w 3d         42  %    FL/BPD:     76.4   %    71 - 87  FL:       62.7  mm     G. Age:  32w 3d         10  %    FL/AC:      21.2   %    20 - 24  Est. FW:    2144  gm    4 lb 12 oz      25  %     FW Discordancy      0 \ 6 % ---------------------------------------------------------------------- OB History  Gravidity:    1         Term:   0        Prem:   0        SAB:   0  TOP:          0       Ectopic:  0        Living: 0 ---------------------------------------------------------------------- Gestational Age (Fetus A)  LMP:           36w 4d        Date:  10/10/19                 EDD:   07/16/20  U/S Today:     33w 2d                                        EDD:   08/08/20  Best:          33w 6d     Det. By:  Marcella Dubs         EDD:   08/04/20 ---------------------------------------------------------------------- Anatomy (Fetus A)  Cranium:               Appears normal         Kidneys:                Appear normal  Heart:                 Appears normal         Bladder:                Appears normal                         (4CH, axis, and                         situs)  Stomach:               Appears normal, left                         sided  Other:  Technicallly difficult due to advanced GA and maternal habitus and          fetal position.  ---------------------------------------------------------------------- Fetal Evaluation (Fetus B)  Num Of Fetuses:  2  Cardiac Activity:       Observed  Fetal Lie:              Maternal left side  Presentation:           Cephalic  Placenta:               Posterior  P. Cord Insertion:      Not well visualized  Membrane Desc:      Dividing Membrane seen - Monochorionic  Amniotic Fluid  AFI FV:      Within normal limits                              Largest Pocket(cm)                              3.78 ---------------------------------------------------------------------- Biophysical Evaluation (Fetus B)  Amniotic F.V:   Pocket => 2 cm             F. Tone:        Observed  F. Movement:    Observed                   Score:          6/8  F. Breathing:   Not Observed ---------------------------------------------------------------------- Biometry (Fetus B)  BPD:      76.5  mm     G. Age:  30w 5d        < 1  %    CI:        67.25   %    70 - 86                                                          FL/HC:      20.7   %    19.4 - 21.8  HC:      298.7  mm     G. Age:  33w 1d          6  %    HC/AC:      1.02        0.96 - 1.11  AC:      292.2  mm     G. Age:  33w 2d         34  %    FL/BPD:     80.7   %    71 - 87  FL:       61.7  mm     G. Age:  32w 0d          5  %    FL/AC:      21.1   %    20 - 24  LV:          4  mm  Est. FW:    2012  gm      4 lb 7 oz     13  %     FW Discordancy         6  % ---------------------------------------------------------------------- Gestational Age (Fetus B)  LMP:           36w 4d        Date:  10/10/19                 EDD:   07/16/20  U/S Today:     32w 2d                                        EDD:   08/15/20  Best:          33w 6d     Det. By:  Marcella Dubs         EDD:   08/04/20 ---------------------------------------------------------------------- Anatomy (Fetus B)  Cranium:               Appears normal         Stomach:                Appears normal, left                                                                         sided  Cavum:                 Appears normal         Kidneys:                Appear normal  Ventricles:            Appears normal         Bladder:                Appears normal  Heart:                 Appears normal                         (4CH, axis, and                         situs) ---------------------------------------------------------------------- Cervix Uterus Adnexa  Cervix  Not visualized (advanced GA >24wks) ---------------------------------------------------------------------- Impression  Ashley Durham is here with monochorionic diamniotic twin  pregnancy for growth and TTTS/TAPS screening.  She has known chronic hypertension that is stable 129/88.  Twin A normal stomach, amniotic fluid, bladder and BPP 8/8-  Cephalic EFW 25th  Twin B normal stomach, amniotic fluid, bladder and BPP 6/8-  Cephalic EFW 13%  Discoradance is at 6%.  I discussed today's visit and recommended continue weekly  testing and repeat growth in 3 weeks.  Given the BPP 6/8 in Twin B I have recommended she go to  MAU for NST.  I personally called MAU  All questions answered. ----------------------------------------------------------------------               Lin Landsman, MD Electronically Signed Final Report   06/22/2020 05:17 pm ----------------------------------------------------------------------  [place] MFM FETAL BPP WO NST ADDL GESTATION  Result Date: 06/10/2020 ----------------------------------------------------------------------  OBSTETRICS REPORT                       (Signed Final 06/10/2020 01:44 pm) ---------------------------------------------------------------------- Patient Info  ID #:       [id number]  D.O.B.:  1985-09-28 (34 yrs)  Name:       Ashley Durham              Visit Date: 06/10/2020 12:58 pm ---------------------------------------------------------------------- Performed By  Attending:        Noralee Space MD        Ref.  Address:     [facility]                                                             & Infertility                                                             [address]                                                             [place], [place]                                                             [address]  Performed By:     Ceasar Lund        Location:         [facility]  Referred By:      Olivia Mackie                    MD ---------------------------------------------------------------------- Orders  #  Description                           Code        Ordered By  1  [place] MFM FETAL BPP WO NON               76819.01    RAVI SHANKAR     STRESS  2  [place] MFM FETAL BPP WO NST               76819.1     RAVI [facility]     ADDL GESTATION ----------------------------------------------------------------------  #  Order #                     Accession #                Episode #  1  [id number]                   [id number]                 [id number]  2  [id number]                   [id number]                 [id number] ---------------------------------------------------------------------- Indications  Twin pregnancy, mono/di, third trimester       O30.033  Hypertension - Chronic/Pre-existing            O10.019  Obesity complicating pregnancy, third          O99.213  trimester  Antenatal screening for malformations          Z36.3  Low risk NIPS, NT WNL  Family history of genetic disorder (FOB)       Z84.89  [redacted] weeks gestation of pregnancy                Z3A.32 ---------------------------------------------------------------------- Fetal Evaluation (Fetus A)  Num Of Fetuses:         2  Fetal Heart Rate(bpm):  150  Cardiac Activity:       Observed  Fetal Lie:              Maternal right, lower   Presentation:           Cephalic  Placenta:               Posterior  P. Cord Insertion:      Not well visualized  Membrane Desc:      Dividing Membrane seen  Amniotic Fluid  AFI FV:      Subjectively low-normal                              Largest Pocket(cm)                              2.41  Comment:    2x2cm pocket seen ---------------------------------------------------------------------- Biophysical Evaluation (Fetus A)  Amniotic F.V:   Within normal limits       F. Tone:        Observed  F. Movement:    Observed                   Score:          8/8  F. Breathing:   Observed ---------------------------------------------------------------------- Biometry (Fetus A)  LV:        2.9  mm ---------------------------------------------------------------------- OB History  Gravidity:    1         Term:   0  Prem:   0        SAB:   0  TOP:          0       Ectopic:  0        Living: 0 ---------------------------------------------------------------------- Gestational Age (Fetus A)  LMP:           34w 6d        Date:  10/10/19                 EDD:   07/16/20  Best:          32w 1d     Det. By:  Marcella Dubs         EDD:   08/04/20 ---------------------------------------------------------------------- Anatomy (Fetus A)  Ventricles:            Appears normal         Bladder:                Appears normal  Stomach:               Appears normal, left                         sided  Other:  All other anatomy previously seen. ---------------------------------------------------------------------- Fetal Evaluation (Fetus B)  Num Of Fetuses:         2  Fetal Heart Rate(bpm):  152  Cardiac Activity:       Observed  Fetal Lie:              Maternal left, upper  Presentation:           Cephalic  Placenta:               Posterior  P. Cord Insertion:      Not well visualized  Membrane Desc:      Dividing Membrane seen  Amniotic Fluid  AFI FV:      Subjectively low-normal                              Largest Pocket(cm)                               2.73  Comment:    2x2cm pocket seen ---------------------------------------------------------------------- Biophysical Evaluation (Fetus B)  Amniotic F.V:   Within normal limits       F. Tone:        Observed  F. Movement:    Observed                   Score:          8/8  F. Breathing:   Observed ---------------------------------------------------------------------- Gestational Age (Fetus B)  LMP:           34w 6d        Date:  10/10/19                 EDD:   07/16/20  Best:          32w 1d     Det. By:  Marcella Dubs         EDD:   08/04/20 ---------------------------------------------------------------------- Anatomy (Fetus B)  Stomach:               Appears normal, left   Bladder:  Appears normal                         sided  Kidneys:               Appear normal  Other:  All other anatomy previously seen ---------------------------------------------------------------------- Impression  Monochorionic-diamniotic twin pregnancy.  Patient return to  for antenatal testing.  She had received antenatal corticosteroids at [redacted] weeks  gestation.  Patient reports at your office examination, the  cervix was 3 cm dilated.  She does not have any uterine  contractions.  Twin A: Lower fetus, maternal right, cephalic presentation,  posterior placenta.  Amniotic fluid is low normal and good  fetal activity seen.  Antenatal testing is reassuring.  BPP 8/8.  Twin B: Upper fetus, maternal left, cephalic presentation,  posterior placenta. Amniotic fluid is low normal and good fetal  activity seen.  Antenatal testing is reassuring.  BPP 8/8.  We reassured the patient of the findings.  Patient  understands the increased likelihood of preterm labor and  delivery. ---------------------------------------------------------------------- Recommendations  -Continue weekly BPP till delivery. ----------------------------------------------------------------------                  Noralee Space, MD  Electronically Signed Final Report   06/10/2020 01:44 pm ----------------------------------------------------------------------   MAU Course/MDM: No orders of the defined types were placed in this encounter.   No orders of the defined types were placed in this encounter.   Treatments in MAU included none.  Called Arlan Organ, CNM who will admit patient to L&D. NICU called and confirmed space in NICU if needed.  Assessment: Ashley Durham is a 35 y.o. G1P0 at [redacted]w[redacted]d by early ultrasound who presents to maternity admissions reporting contractions since 2200 on 06/28/20. S/p BMZ x2 on 4/13 and 4/14. Confirmed cephalic/cephalic (Twin B: head along maternal left) on arrival to MAU today. Pt in latent labor with painful contractions and notable cervical change since last exam. Blood pressure within normal limits. No signs/symptoms of preeclampsia.  1. Monochorionic diamniotic twin gestation in third trimester   2. Chronic hypertension affecting pregnancy    Plan: Plan for admission to L&D for labor management. Labor precautions and fetal kick counts  Sheila Oats, MD OB Fellow, [facility] 06/29/2020 12:42 AM

## 2020-06-30 ENCOUNTER — Encounter (HOSPITAL_COMMUNITY): Payer: Self-pay | Admitting: Obstetrics and Gynecology

## 2020-06-30 DIAGNOSIS — Z8632 Personal history of gestational diabetes: Secondary | ICD-10-CM | POA: Diagnosis present

## 2020-06-30 DIAGNOSIS — O24410 Gestational diabetes mellitus in pregnancy, diet controlled: Secondary | ICD-10-CM | POA: Diagnosis present

## 2020-06-30 LAB — COMPREHENSIVE METABOLIC PANEL
ALT: 20 U/L (ref 0–44)
ALT: 20 U/L (ref 0–44)
AST: 18 U/L (ref 15–41)
AST: 17 U/L (ref 15–41)
Albumin: 2.2 g/dL — ABNORMAL LOW (ref 3.5–5.0)
Albumin: 2.2 g/dL — ABNORMAL LOW (ref 3.5–5.0)
Alkaline Phosphatase: 331 U/L — ABNORMAL HIGH (ref 38–126)
Alkaline Phosphatase: 296 U/L — ABNORMAL HIGH (ref 38–126)
Anion gap: 10 (ref 5–15)
Anion gap: 6 (ref 5–15)
BUN: 11 mg/dL (ref 6–20)
BUN: 13 mg/dL (ref 6–20)
CO2: 20 mmol/L — ABNORMAL LOW (ref 22–32)
CO2: 23 mmol/L (ref 22–32)
Calcium: 9 mg/dL (ref 8.9–10.3)
Calcium: 8.7 mg/dL — ABNORMAL LOW (ref 8.9–10.3)
Chloride: 105 mmol/L (ref 98–111)
Chloride: 106 mmol/L (ref 98–111)
Creatinine, Ser: 0.66 mg/dL (ref 0.44–1.00)
Creatinine, Ser: 0.77 mg/dL (ref 0.44–1.00)
GFR, Estimated: >60 mL/min (ref >60)
GFR, Estimated: >60 mL/min (ref >60)
Glucose, Bld: 113 mg/dL — ABNORMAL HIGH (ref 70–99)
Glucose, Bld: 141 mg/dL — ABNORMAL HIGH (ref 70–99)
Potassium: 3.5 mmol/L (ref 3.5–5.1)
Potassium: 3.4 mmol/L — ABNORMAL LOW (ref 3.5–5.1)
Sodium: 135 mmol/L (ref 135–145)
Sodium: 135 mmol/L (ref 135–145)
Total Bilirubin: 0.5 mg/dL (ref 0.3–1.2)
Total Bilirubin: 0.4 mg/dL (ref 0.3–1.2)
Total Protein: 5.8 g/dL — ABNORMAL LOW (ref 6.5–8.1)
Total Protein: 5.6 g/dL — ABNORMAL LOW (ref 6.5–8.1)

## 2020-06-30 LAB — CBC
HCT: 35.8 % — ABNORMAL LOW (ref 36.0–46.0)
Hemoglobin: 11.4 g/dL — ABNORMAL LOW (ref 12.0–15.0)
MCH: 23.8 pg — ABNORMAL LOW (ref 26.0–34.0)
MCHC: 31.8 g/dL (ref 30.0–36.0)
MCV: 74.9 fL — ABNORMAL LOW (ref 80.0–100.0)
Platelets: 277 10*3/uL (ref 150–400)
RBC: 4.78 MIL/uL (ref 3.87–5.11)
RDW: 14.8 % (ref 11.5–15.5)
WBC: 13.5 10*3/uL — ABNORMAL HIGH (ref 4.0–10.5)
nRBC: 0 % (ref 0.0–0.2)

## 2020-06-30 NOTE — Lactation Note (Signed)
This note was copied from a baby's chart. Lactation Consultation Note  Patient Name: Ashley Durham IZTIW'P Date: 06/30/2020 Reason for consult: Follow-up assessment;1st time breastfeeding;NICU baby;Primapara;Multiple gestation;Late-preterm 34-36.6wks;Maternal endocrine disorder Age:34 hours  LC in to visit Mom.  Mom states she was exhausted last night and she slept well.  Mom was shown how to do breast massage and hand expression, but hadn't started pumping yet.  Mom willing and ready to start this am.  LC set up DEBP and assisted Mom with first pumping, 24 mm flange size appears a good fit.  Encouraged Mom to do STS with baby or babies in the NICU if able to, and pump both breasts on initiation setting after.  Goal of pumping 8 or more times per 24 hrs reviewed.  Reviewed importance of disassembling pump parts, washing, rinsing and air drying parts in separate bin.  Mom inquired about sanitizing pump parts.  Mom has some Medela Quick Clean bags at home, suggested she speak to baby's RN about using them.  NICU booklet provided and Mom aware of IP and OP lactation support available to her.   Lactation Tools Discussed/Used Tools: Pump;Flanges Flange Size: 24 Breast pump type: Double-Electric Breast Pump Pump Education: Setup, frequency, and cleaning;Milk Storage Reason for Pumping: Support milk supply/LPT twins in NICU Pumping frequency: Started this am Pumped volume: 0 mL  Discharge Pump: DEBP;Personal (Spectra 2) WIC Program: No  Consult Status Consult Status: Follow-up Date: 07/01/20 Follow-up type: In-patient    Judee Clara 06/30/2020, 10:22 AM

## 2020-06-30 NOTE — Progress Notes (Signed)
PPD #1, s/p preterm mono/di twin vaginal delivery, first degree perineal repair Twin A: "Levi" - NSVD Twin B: "Caleb" - VAVD  S:  Reports feeling much better today, was able to rest overnight and planning to go see the twins in NICU after breakfast  Denies HA, visual changes, RUQ/epigastric pain              Tolerating po/ No nausea or vomiting / Denies dizziness or SOB             Bleeding is moderate             Pain somewhat controlled with Motrin             Up ad lib / ambulatory / voiding QS without difficulty   Newborns are stable in NICU, mom plans to pump until she can breastfeed   / Circumcision - declines   O:               VS: BP 128/89 (BP Location: Right Arm)   Pulse 87   Temp 97.8 F (36.6 C) (Oral)   Resp 18   Ht 5\' 4"  (1.626 m)   Wt 134.7 kg   LMP 10/10/2019   SpO2 98%   Breastfeeding Unknown   BMI 50.98 kg/m   Patient Vitals for the past 24 hrs:  BP Temp Temp src Pulse Resp SpO2  06/30/20 0716 128/89 97.8 F (36.6 C) Oral 87 18 98 %  06/30/20 0427 130/74 98.2 F (36.8 C) -- 78 19 --  06/30/20 0016 128/76 98 F (36.7 C) -- 88 18 --  06/29/20 2053 140/77 98 F (36.7 C) Oral 99 20 --  06/29/20 1934 (!) 147/86 -- -- (!) 101 -- --  06/29/20 1830 (!) 151/84 -- -- 91 20 --  06/29/20 1815 -- 98 F (36.7 C) Oral -- 18 --  06/29/20 1802 (!) 141/73 -- -- 89 -- --  06/29/20 1747 (!) 142/87 -- -- 90 -- --  06/29/20 1732 (!) 159/101 -- -- 96 -- --  06/29/20 1725 (!) 153/85 -- -- 94 -- --  06/29/20 1637 (!) 153/82 -- -- (!) 109 -- --  06/29/20 1603 134/87 -- -- 99 20 --  06/29/20 1535 119/64 98 F (36.7 C) Oral 90 18 --  06/29/20 1500 130/73 -- -- 95 -- --  06/29/20 1455 132/75 -- -- 99 -- --  06/29/20 1450 131/74 -- -- (!) 102 -- --  06/29/20 1442 134/81 -- -- 93 -- --  06/29/20 1438 129/74 -- -- 91 -- --  06/29/20 1437 129/74 -- -- 91 -- --  06/29/20 1430 132/74 -- -- 96 18 --  06/29/20 1427 121/78 -- -- 91 -- --  06/29/20 1425 130/73 -- -- 90 -- --   06/29/20 1417 131/80 -- -- 95 -- --  06/29/20 1407 125/86 -- -- 94 -- --  06/29/20 1402 132/72 -- -- 89 -- --  06/29/20 1347 132/76 -- -- 89 -- --  06/29/20 1257 (!) 157/93 98.2 F (36.8 C) Oral 95 20 --  06/29/20 1252 -- -- -- 87 -- --  06/29/20 1250 (!) 171/98 -- -- 89 18 --  06/29/20 1219 (!) 165/99 -- -- 94 18 --  06/29/20 1138 (!) 154/108 -- -- (!) 107 -- --  06/29/20 1133 -- -- -- -- 18 --  06/29/20 1024 -- 98.5 F (36.9 C) Oral -- 18 --  06/29/20 0945 (!) 135/92 98 F (36.7 C) Oral (!) 114 18 --  06/29/20 0916 (!) 141/108 -- -- (!) 118 -- --     LABS:  Results for orders placed or performed during the hospital encounter of 06/28/20 (from the past 24 hour(s))  CBC     Status: Abnormal   Collection Time: 06/29/20 12:42 PM  Result Value Ref Range   WBC 15.2 (H) 4.0 - 10.5 K/uL   RBC 5.22 (H) 3.87 - 5.11 MIL/uL   Hemoglobin 12.5 12.0 - 15.0 g/dL   HCT 67.1 24.5 - 80.9 %   MCV 75.1 (L) 80.0 - 100.0 fL   MCH 23.9 (L) 26.0 - 34.0 pg   MCHC 31.9 30.0 - 36.0 g/dL   RDW 98.3 38.2 - 50.5 %   Platelets 312 150 - 400 K/uL   nRBC 0.0 0.0 - 0.2 %  CBC     Status: Abnormal   Collection Time: 06/29/20  6:26 PM  Result Value Ref Range   WBC 15.9 (H) 4.0 - 10.5 K/uL   RBC 4.65 3.87 - 5.11 MIL/uL   Hemoglobin 11.3 (L) 12.0 - 15.0 g/dL   HCT 39.7 (L) 67.3 - 41.9 %   MCV 74.8 (L) 80.0 - 100.0 fL   MCH 24.3 (L) 26.0 - 34.0 pg   MCHC 32.5 30.0 - 36.0 g/dL   RDW 37.9 02.4 - 09.7 %   Platelets 278 150 - 400 K/uL   nRBC 0.0 0.0 - 0.2 %  CBC     Status: Abnormal   Collection Time: 06/30/20  6:59 AM  Result Value Ref Range   WBC 13.5 (H) 4.0 - 10.5 K/uL   RBC 4.78 3.87 - 5.11 MIL/uL   Hemoglobin 11.4 (L) 12.0 - 15.0 g/dL   HCT 35.3 (L) 29.9 - 24.2 %   MCV 74.9 (L) 80.0 - 100.0 fL   MCH 23.8 (L) 26.0 - 34.0 pg   MCHC 31.8 30.0 - 36.0 g/dL   RDW 68.3 41.9 - 62.2 %   Platelets 277 150 - 400 K/uL   nRBC 0.0 0.0 - 0.2 %  Comprehensive metabolic panel     Status: Abnormal    Collection Time: 06/30/20  6:59 AM  Result Value Ref Range   Sodium 135 135 - 145 mmol/L   Potassium 3.5 3.5 - 5.1 mmol/L   Chloride 105 98 - 111 mmol/L   CO2 20 (L) 22 - 32 mmol/L   Glucose, Bld 113 (H) 70 - 99 mg/dL   BUN 11 6 - 20 mg/dL   Creatinine, Ser 2.97 0.44 - 1.00 mg/dL   Calcium 9.0 8.9 - 98.9 mg/dL   Total Protein 5.8 (L) 6.5 - 8.1 g/dL   Albumin 2.2 (L) 3.5 - 5.0 g/dL   AST 18 15 - 41 U/L   ALT 20 0 - 44 U/L   Alkaline Phosphatase 331 (H) 38 - 126 U/L   Total Bilirubin 0.5 0.3 - 1.2 mg/dL   GFR, Estimated >21 >19 mL/min   Anion gap 10 5 - 15               Blood type: --/--/O POS (04/20 0115)  Rubella:        Immune               I&O: Intake/Output      04/20 0701 04/21 0700 04/21 0701 04/22 0700   P.O. 1020 120   Total Intake(mL/kg) 1020 (7.6) 120 (0.9)   Urine (mL/kg/hr) 3300 (1)    Blood 75    Total Output 3375  Net -2355 +120         Vaccines: TDaP          declined                    Flu             declined                    COVID-19 UTD              Physical Exam:             Alert and oriented X3  Lungs: Clear and unlabored  Heart: regular rate and rhythm / no murmurs  Abdomen: soft, non-tender, non-distended/ obese             Fundus: firm, non-tender, U below umbilicus   Perineum: well approximated 1st degree, mild edema   Lochia: appropriate  Extremities: +1 LE edema, no calf pain or tenderness, DTRS +1, no clonus bilaterally     A: PPD # 1, SVD, preterm mono/di twins in NICU  1st degree perineal repair   Chronic HTN vs. GHTN   - not on medication, BPs slightly elevated per home cuff with mildly elevated ALT at 18 weeks, then normalized with newonset exacerbation within the last week of pregnancy and during delivery   - BPs improving this morning and no neural s/s   - CMP added today    - Continue close BP monitoring and Strict &O   - Daily weights    - Will plan for BP check in 1 week after discharge   A1GDM    - f/u PP   Hx. Of  PCOS  Doing well - stable status  Routine post partum orders  Lactation support for NICU moms appreciated   Encouraged to rest  Continue current care  Carlean Jews, MSN, CNM [facility]

## 2020-06-30 NOTE — Anesthesia Postprocedure Evaluation (Signed)
Anesthesia Post Note  Patient: Ashley Durham Cal  Procedure(s) Performed: CESAREAN SECTION (canceled)     Patient location during evaluation: Mother Baby Anesthesia Type: MAC Level of consciousness: awake Pain management: satisfactory to patient Vital Signs Assessment: post-procedure vital signs reviewed and stable Respiratory status: spontaneous breathing Cardiovascular status: stable Anesthetic complications: no   No complications documented.  Last Vitals:  Vitals:   06/30/20 0427 06/30/20 0716  BP: 130/74 128/89  Pulse: 78 87  Resp: 19 18  Temp: 36.8 C 36.6 C  SpO2:  98%    Last Pain:  Vitals:   06/30/20 0758  TempSrc:   PainSc: 0-No pain   Pain Goal:                   [employer]

## 2020-06-30 NOTE — Progress Notes (Addendum)
Patient screened out for psychosocial assessment since none of the following apply: °Psychosocial stressors documented in mother or baby's chart °Gestation less than 32 weeks °Code at delivery  °Infant with anomalies °Please contact the Clinical Social Worker if specific needs arise, by MOB's request, or if MOB scores greater than 9/yes to question 10 on Edinburgh Postpartum Depression Screen. ° °Jeryn Bertoni Boyd-Gilyard, MSW, LCSW °Clinical Social Work °[phone] °  °

## 2020-06-30 NOTE — Addendum Note (Signed)
Addendum  created 06/30/20 0953 by Algis Greenhouse, CRNA   Charge Capture section accepted, Clinical Note Signed, Visit diagnoses modified

## 2020-07-01 ENCOUNTER — Ambulatory Visit: Payer: No Typology Code available for payment source

## 2020-07-01 MED ORDER — NIFEDIPINE ER OSMOTIC RELEASE 30 MG PO TB24
30.0000 mg | ORAL_TABLET | Freq: Every day | ORAL | Status: DC
  Administered 2020-07-01: 30 mg via ORAL
  Filled 2020-07-01: qty 1

## 2020-07-01 MED ORDER — ACETAMINOPHEN 500 MG PO TABS: 1000.0000 mg | ORAL_TABLET | Freq: Four times a day (QID) | ORAL | 0 refills | Status: DC | PRN

## 2020-07-01 MED ORDER — COCONUT OIL OIL: 1.0000 "application " | TOPICAL_OIL | 0 refills | Status: DC | PRN

## 2020-07-01 MED ORDER — NIFEDIPINE ER OSMOTIC RELEASE 30 MG PO TB24: 30.0000 mg | ORAL_TABLET | Freq: Every day | ORAL | Status: DC

## 2020-07-01 MED ORDER — NIFEDIPINE ER 30 MG PO TB24: 30.0000 mg | ORAL_TABLET | Freq: Every day | ORAL | 1 refills | Status: DC

## 2020-07-01 MED ORDER — IBUPROFEN 600 MG PO TABS: 600.0000 mg | ORAL_TABLET | Freq: Four times a day (QID) | ORAL | 0 refills | Status: DC

## 2020-07-01 MED ORDER — NIFEDIPINE ER 30 MG PO TB24: 30.0000 mg | ORAL_TABLET | Freq: Every day | ORAL | 0 refills | Status: DC

## 2020-07-01 MED ORDER — BENZOCAINE-MENTHOL 20-0.5 % EX AERO: 1.0000 "application " | INHALATION_SPRAY | CUTANEOUS | Status: DC | PRN

## 2020-07-01 MED ORDER — HYDROCHLOROTHIAZIDE 12.5 MG PO CAPS
12.5000 mg | ORAL_CAPSULE | Freq: Every day | ORAL | Status: DC
  Administered 2020-07-01: 12.5 mg via ORAL
  Filled 2020-07-01 (×2): qty 1

## 2020-07-01 MED ORDER — HYDROCHLOROTHIAZIDE 12.5 MG PO CAPS: 12.5000 mg | ORAL_CAPSULE | Freq: Every day | ORAL | 0 refills | Status: DC

## 2020-07-01 MED ORDER — SENNOSIDES-DOCUSATE SODIUM 8.6-50 MG PO TABS: 2.0000 | ORAL_TABLET | Freq: Every day | ORAL | Status: DC

## 2020-07-01 MED ORDER — ACETAMINOPHEN 500 MG PO TABS: 1000.0000 mg | ORAL_TABLET | Freq: Four times a day (QID) | ORAL | 2 refills | Status: AC | PRN

## 2020-07-01 MED ORDER — IBUPROFEN 600 MG PO TABS: 600.0000 mg | ORAL_TABLET | Freq: Three times a day (TID) | ORAL | 0 refills | Status: DC | PRN

## 2020-07-01 NOTE — Plan of Care (Signed)
  Problem: Education: Goal: Knowledge of General Education information will improve Description: Including pain rating scale, medication(s)/side effects and non-pharmacologic comfort measures Outcome: Adequate for Discharge   Problem: Health Behavior/Discharge Planning: Goal: Ability to manage health-related needs will improve Outcome: Adequate for Discharge   Problem: Clinical Measurements: Goal: Ability to maintain clinical measurements within normal limits will improve Outcome: Adequate for Discharge Goal: Will remain free from infection Outcome: Adequate for Discharge Goal: Diagnostic test results will improve Outcome: Adequate for Discharge Goal: Respiratory complications will improve Outcome: Adequate for Discharge Goal: Cardiovascular complication will be avoided Outcome: Adequate for Discharge   Problem: Activity: Goal: Risk for activity intolerance will decrease Outcome: Adequate for Discharge   Problem: Nutrition: Goal: Adequate nutrition will be maintained Outcome: Adequate for Discharge   Problem: Coping: Goal: Level of anxiety will decrease Outcome: Adequate for Discharge   Problem: Elimination: Goal: Will not experience complications related to bowel motility Outcome: Adequate for Discharge Goal: Will not experience complications related to urinary retention Outcome: Adequate for Discharge   Problem: Pain Managment: Goal: General experience of comfort will improve Outcome: Adequate for Discharge   Problem: Safety: Goal: Ability to remain free from injury will improve Outcome: Adequate for Discharge   Problem: Skin Integrity: Goal: Risk for impaired skin integrity will decrease Outcome: Adequate for Discharge   Problem: Education: Goal: Knowledge of Childbirth will improve Outcome: Adequate for Discharge Goal: Ability to make informed decisions regarding treatment and plan of care will improve Outcome: Adequate for Discharge Goal: Ability to state  and carry out methods to decrease the pain will improve Outcome: Adequate for Discharge Goal: Individualized Educational Video(s) Outcome: Adequate for Discharge   Problem: Coping: Goal: Ability to verbalize concerns and feelings about labor and delivery will improve Outcome: Adequate for Discharge   Problem: Life Cycle: Goal: Ability to make normal progression through stages of labor will improve Outcome: Adequate for Discharge Goal: Ability to effectively push during vaginal delivery will improve Outcome: Adequate for Discharge   Problem: Role Relationship: Goal: Will demonstrate positive interactions with the child Outcome: Adequate for Discharge   Problem: Safety: Goal: Risk of complications during labor and delivery will decrease Outcome: Adequate for Discharge   Problem: Pain Management: Goal: Relief or control of pain from uterine contractions will improve Outcome: Adequate for Discharge   

## 2020-07-01 NOTE — Progress Notes (Signed)
At 1255 d/c instructions given, all questions answered, and pt verbalized understanding. LC to see pt before leaving and pt would like to visit NICU before leaving. Pt is alert and oriented x4 and ambulatory. VS WDL and pain tolerable.

## 2020-07-01 NOTE — Discharge Summary (Signed)
OB Discharge Summary  Patient Name: Ashley Durham DOB: 26-Sep-1985 MRN: [id number]  Date of admission: 06/28/2020 Delivering provider:    Takeila, Thayne [[id number]]  Peyton Bottoms [place], [place] Rielynn [[id number]]  Arlan Organ C    Admitting diagnosis: Normal labor [O80, Z37.9] Intrauterine pregnancy: [redacted]w[redacted]d     Secondary diagnosis: Patient Active Problem List   Diagnosis Date Noted  . First degree perineal laceration 06/30/2020  . Gestational diabetes mellitus, class A1 06/30/2020  . Normal labor 06/29/2020  . Postpartum care following vaginal delivery 4/20 06/29/2020  . Vacuum-assisted vaginal delivery Baby B 06/29/2020  . Spontaneous vaginal delivery Baby A 06/29/2020  . Monochorionic diamniotic twin gestation in third trimester 06/23/2020  . Chronic hypertension affecting pregnancy 06/23/2020  . Obesity complicating pregnancy 06/23/2020  . Preterm labor 05/19/2020   Additional problems:none   Date of discharge: 07/01/2020   Discharge diagnosis: Principal Problem:   Postpartum care following vaginal delivery 4/20 Active Problems:   Chronic hypertension affecting pregnancy  -Blood Pressures continue to be labile. No neural symptoms present. Follow up in 5 days for BP check   Normal labor   Vacuum-assisted vaginal delivery Baby B   Spontaneous vaginal delivery Baby A   First degree perineal laceration   Gestational diabetes mellitus, class A1                                                              Post partum procedures:none  Augmentation: AROM and Pitocin Pain control:    Ellaree, Gear [[id number]]  Epidural    Nyriah, Coote [[id number]]  Epidural   Laceration:   Sophya, Vanblarcom [[id number]]  1st degree;Labial    Tearia, Gibbs [[id number]]  1st degree;Labial   Episiotomy:   Nazia, Rhines [[id number]]  [address] [[id number]]  None   Complications:  None  Hospital course:  Onset of Labor With Vaginal Delivery   SVD of Baby A Vacuum assisted vaginal delivery of Baby B   35 y.o. yo G1P0102 at [redacted]w[redacted]d was admitted in Active Labor on 06/28/2020. Patient had an uncomplicated labor course as follows:  Membrane Rupture Time/Date:    Tya, Haughey [[id number]]  9:55 AM    [address] [[id number]]  5:05 PM  ,   Mykah, Bellomo [[id number]]  06/29/2020    Deseray, Daponte [[id number]]  06/29/2020    Delivery Method:   Mitchell Heir [[id number]]  Vaginal, Spontaneous    Josslynn, Mentzer [[id number]]  Vaginal, Vacuum (Extractor)   Episiotomy:    Bettyanne, Dittman [[id number]]  None    [address] Vinc[place] He[place] [[id number]]  None   Lacerations:     Kalyse, Meharg [[id number]]  1st degree;Labial    [address] Kyriana, Yankee [[id number]]  1st degree;Labial   Patient had an uncomplicated postpartum course.  She is ambulating, tolerating a regular diet, passing flatus, and urinating well. Patient is discharged home in stable condition on 07/01/20.  Newborn Data: Birth date:   Debrah, Granderson [[id number]]  06/29/2020    Brailynn, Breth [[id number]]  06/29/2020   Birth time:   [place], [place]  Caroleena [[id number]]  5:02 PM    [address] Gregory, Dowe [[id number]]  5:07 PM   Gender:   Yuna, Pizzolato [[id number]]  Female    [place] [[id number]]  Female   Living status:   Bruna, Dills [[id number]]  HTDSKA    JGOTLX Vincenza Hews [place] [[id number]]  Living   Apgars:   Reham, Slabaugh [[id number]]  [address] [[id number]]  8  ,   Sylvan, [place]m [place] [[id number]]  [address] Vincenza Hews [place] [[id number]]  9   Weight:   Babetta, Paterson [[id number]]  2120 g    Tamyra, Fojtik [[id number]]  2260 g    Physical exam  Vitals:   06/30/20 1956  07/01/20 0020 07/01/20 0618 07/01/20 0742  BP: 138/80 (!) 140/92 135/77 138/75  Pulse: 89 89 80 77  Resp: 18 18 18 18   Temp: 97.8 F (36.6 C) 98 F (36.7 C) 97.6 F (36.4 C) 98.1 F (36.7 C)  TempSrc: Oral Oral Oral Oral  SpO2: 99% 98% 99% 100%  Weight:   129.7 kg   Height:       General: alert, cooperative and no distress Lochia: minimal without clots Uterine Fundus: firm below umbilicus Incision: N/A Perineum: 1st degree repair , approximated well with minimal edema. No erythema or signs of infection.  DVT Evaluation: moderate edema with mild pitting. No calf pain or tenderness, no erythema or signs of DVT.   Labs: Lab Results  Component Value Date   WBC 13.5 (H) 06/30/2020   HGB 11.4 (L) 06/30/2020   HCT 35.8 (L) 06/30/2020   MCV 74.9 (L) 06/30/2020   PLT 277 06/30/2020   CMP Latest Ref Rng & Units 06/30/2020  Glucose 70 - 99 mg/dL 07/02/2020)  BUN 6 - 20 mg/dL 13  Creatinine 505(W - 9.79 mg/dL 4.80  Sodium 1.65 - 537 mmol/L 135  Potassium 3.5 - 5.1 mmol/L 3.4(L)  Chloride 98 - 111 mmol/L 106  CO2 22 - 32 mmol/L 23  Calcium 8.9 - 10.3 mg/dL 482)  Total Protein 6.5 - 8.1 g/dL 7.0(B)  Total Bilirubin 0.3 - 1.2 mg/dL 0.4  Alkaline Phos 38 - 126 U/L 296(H)  AST 15 - 41 U/L 17  ALT 0 - 44 U/L 20   No flowsheet data found. Vaccines: TDaP          UTD         Flu             declined                    COVID-19 declined  Discharge instruction:  per After Visit Summary,  [facility] booklet and  "Understanding Mother & Baby Care" hospital booklet  After Visit Meds:  Allergies as of 07/01/2020   No Known Allergies     Medication List    STOP taking these medications   calcium carbonate 500 MG chewable tablet Commonly known as: TUMS - dosed in mg elemental calcium     TAKE these medications   acetaminophen 500 MG tablet Commonly known as: TYLENOL Take 2 tablets (1,000 mg total) by mouth every 6 (six) hours as needed.   benzocaine-Menthol 20-0.5 %  Aero Commonly known as: DERMOPLAST Apply 1 application topically as needed for irritation (perineal discomfort).   cholecalciferol 25 MCG (1000 UNIT) tablet Commonly known as: VITAMIN D3 Take 1,000 Units by mouth  daily.   coconut oil Oil Apply 1 application topically as needed.   hydrochlorothiazide 12.5 MG capsule Commonly known as: MICROZIDE Take 1 capsule (12.5 mg total) by mouth daily for 5 days.   ibuprofen 600 MG tablet Commonly known as: ADVIL Take 1 tablet (600 mg total) by mouth every 6 (six) hours.   NIFEdipine 30 MG 24 hr tablet Commonly known as: ADALAT CC Take 1 tablet (30 mg total) by mouth daily. Start taking on: July 02, 2020   prenatal multivitamin Tabs tablet Take 1 tablet by mouth daily at 12 noon.   senna-docusate 8.6-50 MG tablet Commonly known as: Senokot-S Take 2 tablets by mouth daily. Start taking on: July 02, 2020   vitamin C 1000 MG tablet Take 1,000 mg by mouth daily.       Diet: routine diet  Activity: Advance as tolerated. Pelvic rest for 6 weeks.   Postpartum contraception: Not Discussed  Newborn Data:   Shemaiah, Round [[id number]]  Live born female  Birth Weight: 4 lb 10.8 oz (2120 g) APGAR: 7, 8  Newborn Delivery   Birth date/time: 06/29/2020 17:02:00 Delivery type: Vaginal, Spontaneous       Loraine, Bhullar [[id number]]  Live born female  Birth Weight: 4 lb 15.7 oz (2260 g) APGAR: 8, 9  Newborn Delivery   Birth date/time: 06/29/2020 17:07:00 Delivery type: Vaginal, Vacuum (Extractor)      Baby Boy A named "[name]" and Baby boy B named "Caleb" Baby Feeding: Currently pumping; Babies to NICU for prematurity at 34weeks and 6days.  Disposition:NICU Circumcision: deferred due to NICU admission.     Delivery Report:   Review the Delivery Report for details.    Follow up:  Follow-up Information    Neta Mends, CNM. Schedule an appointment as soon as possible for a visit in 5 day(s).   Specialty:  Obstetrics and Gynecology Why: For Blood pressure check.  Contact information: [address] [place] [address] [phone]                 Signed: Sissy Hoff, MSN 07/01/2020, 10:48 AM

## 2020-07-01 NOTE — Discharge Instructions (Signed)
Lactation outpatient support - home visit  Linda Coppola RN, MHA, IBCLC at [facility]: Lactation Consultant  [email or web address] Mail: [email or web address] Tel: [phone]    Additional resources:  [facility] [email or web address]   Chiropractic specialist   Dr. Leanna Hastings [email or web address]  Craniosacral therapy for baby  Erin Balkind  [email or web address]  

## 2020-07-01 NOTE — Lactation Note (Signed)
This note was copied from a baby's chart. Lactation Consultation Note  Patient Name: Ashley Durham KDTOI'Z Date: 07/01/2020 Reason for consult: Follow-up assessment Age:35 hours  Follow up visit to P1 mother of 45 hours old LPT twins currently in NICU. Mother states she has been pumping but only collect drops of EBM. Mother seems discouraged. Mother shares nipples are mildly sore.  Discussed the importance of breast stimulation, self-care and its influence to milk supply. Encouraged mother to continue pumping at least 8 times a day including night time. LC reviewed hand expression, manual pump and check flange size. 8mm seems a better fit for mother. Observed a bruise over right nipple consistent with misplaced flange. Reinforced the importance of center nipple in flange. LC provided comfort gels to sooth nipples after pumping and explained how to use coconut oil to flanges.  Mother shares excitement because twins are doing well. Twins are taking donor milk. Celebrated good news with mother.  Encouraged to request [facility] for any needs, support or questions. Praised mother for her effort and dedication. Mother is going home today.   Maternal Data Has patient been taught Hand Expression?: Yes  Feeding Mother's Current Feeding Choice: Breast Milk and Donor Milk  Lactation Tools Discussed/Used Tools: Pump;Flanges;Comfort gels Flange Size: 21;24 Breast pump type: Double-Electric Breast Pump Pump Education: Setup, frequency, and cleaning;Milk Storage Reason for Pumping: maternal infant separation Pumping frequency: Q3 Pumped volume:  (drops)  Interventions Interventions: Breast feeding basics reviewed;Breast massage;Hand express;Expressed milk;Coconut oil;Comfort gels;Hand pump;DEBP;Education  Discharge Pump: DEBP;Personal  Consult Status Consult Status: Follow-up Date: 07/02/20 Follow-up type: In-patient    Larita Deremer A Higuera [place]idey 07/01/2020, 2:09 PM

## 2020-07-04 ENCOUNTER — Ambulatory Visit: Payer: Self-pay

## 2020-07-04 NOTE — Lactation Note (Signed)
This note was copied from a baby's chart. Lactation Consultation Note  Patient Name: Ashley Durham [id number] Date: 07/04/2020 Reason for consult: Follow-up assessment;Primapara;1st time breastfeeding;NICU baby;Late-preterm 34-36.6wks;Infant < 6lbs;Multiple gestation Age:34 days   "Ashley Durham" LC in to assist/assess with positioning and latching to the breast.   Baby A placed STS in football hold on right breast.  Changed from a 20 mm to a 16 mm nipple shield for a more secure fit to pull more nipple into shield.  Baby Ashley Durham fed on and off for 25 mins, occasional swallow identified.  Baby relaxed without any signs of stress throughout feeding. Small amount of milk noted in shield.  Mom's breasts are feeling heavier today.   "Ashley Durham"  Baby B placed STS in football hold on left breast.  Baby sleepy and not rooting to latch.  After trying to entice baby with drops of milk to baby's lips, placed baby STS on Mom's chest.  Baby not cueing.  Mom assisted to pump on maintenance setting for 15-30 mins.  Mom has a hand's free pumping bra.  Flanges changed to 24 mm for a better fit.  Warm packs placed in bra and encouraged Mom to massage while pumping.  Talked about power pumping to stimulate her milk supply.  Mom using a Spectra DEBP at home.  Mom aware of [employer] Symphony (hospital grade) available in gift shop to rent.   Lactation Tools Discussed/Used Tools: Pump;Flanges Flange Size: 21 Breast pump type: Double-Electric Breast Pump Pumping frequency: Q 2-3 hrs Pumped volume: 10 mL  Interventions Interventions: Assisted with latch;Skin to skin;Breast massage;Hand express;Breast compression;Support pillows;Shells;Education (With Lactation Acupuncturist. User may not have seen previous data.)  Consult Status Consult Status: Follow-up Date: 07/06/20 Follow-up type: In-patient    Ashley Durham 07/04/2020, 3:25 PM

## 2020-07-06 ENCOUNTER — Ambulatory Visit: Payer: Self-pay

## 2020-07-06 NOTE — Lactation Note (Signed)
This note was copied from a baby's chart. Lactation Consultation Note LC to room for bf assistance. Observed baby A latch and bf with rhythmic suckling pattern and audible swallowing for 30 minutes. +milk visible in shield p bf.   Patient Name: Ashley Durham [id number] Date: 07/06/2020   Age:34 days  Maternal Data  Mom with delay of onset of copious milk supply at 7 days pp. Last pumping yielded .   LATCH Score Latch: Grasps breast easily, tongue down, lips flanged, rhythmical sucking.  Audible Swallowing: Spontaneous and intermittent  Type of Nipple: Everted at rest and after stimulation  Comfort (Breast/Nipple): Soft / non-tender  Hold (Positioning): Assistance needed to correctly position infant at breast and maintain latch.  LATCH Score: 9   Lactation Tools Discussed/Used Flange Size: Other (comment) (16)    Elder Negus, MA IBCLC 07/06/2020, 3:37 PM

## 2020-07-06 NOTE — Lactation Note (Addendum)
This note was copied from a baby's chart. Lactation Consultation Note LC to room for latch assistance with baby B. Observed rhythmic suckling with audible swallows for about 20 minutes. +milk visible in shield p bf.   Patient Name: Ashley Durham Date: 07/06/2020   Age:34 days  Maternal Data  Mom with delay of onset of copious milk on day 7. Last pumping yielded . Will continue to follow supply.    LATCH Score Latch: Grasps breast easily, tongue down, lips flanged, rhythmical sucking.  Audible Swallowing: Spontaneous and intermittent  Type of Nipple: Everted at rest and after stimulation  Comfort (Breast/Nipple): Soft / non-tender  Hold (Positioning): Assistance needed to correctly position infant at breast and maintain latch.  LATCH Score: [address] [place], [place] IBCLC 07/06/2020, 3:43 PM

## 2020-07-09 ENCOUNTER — Ambulatory Visit: Payer: Self-pay

## 2020-07-09 NOTE — Lactation Note (Signed)
This note was copied from a baby's chart. Lactation Consultation Note Baby A was at breast with combination of nutritive and non-nutritive suckling during visit. A few audible swallows heard. Mom's supply is increasing appropriately over the past few days. Today her volumes range from 45-47mls. Both twins are bf'ing.    Patient Name: Ashley Durham [id number] Date: 07/09/2020 Reason for consult: NICU baby;Follow-up assessment Age:34 days  Maternal Data   Mom complains of soreness in R breast. Reviewed s/s plugged duct and provided recommended guidance to relieve.   Feeding Mother's Current Feeding Choice: Breast Milk and Formula Nipple Type: Dr. Levert Feinstein Preemie  LATCH Score Latch: Repeated attempts needed to sustain latch, nipple held in mouth throughout feeding, stimulation needed to elicit sucking reflex.  Audible Swallowing: Spontaneous and intermittent  Type of Nipple: Flat  Comfort (Breast/Nipple): Soft / non-tender  Hold (Positioning): Assistance needed to correctly position infant at breast and maintain latch.  LATCH Score: 7   Lactation Tools Discussed/Used    Interventions Interventions: Assisted with latch;Skin to skin;Hand express;Adjust position;Support pillows  Consult Status Consult Status: Follow-up Follow-up type: In-patient   Elder Negus, MA IBCLC 07/09/2020, 2:51 PM

## 2020-07-11 ENCOUNTER — Ambulatory Visit: Payer: Self-pay

## 2020-07-11 NOTE — Lactation Note (Signed)
This note was copied from a baby's chart. Lactation Consultation Note  Patient Name: Ashley Durham [id number] Date: 07/11/2020 Reason for consult: Follow-up assessment;Multiple gestation Age:34 days  Follow up consult per mother's request. Mother reports she is collecting 2-3oz per pumping session and pumps ~8 times a day. Discussed pumping with her let down, using warm compresses and massaging while pumping.   [name] is latched football position to left breast with nipple shield. Noted good suckling, pacing and swallowing. Encouraged mother to support neck and back maintaining an asymetrical latch. Infant was able to breastfeed for >67min. Observed swallowing and milk in the shield. Took additional 10 mL via bottle.   [name]: Mother reports breastfed for ~65min. Mother explains he does not seem consistent at breast. LC observed lip curling when bottlefeeding as well as with pacifier. Demonstrated how identify and correct. Infant took ~64mL via bottle.   Encouraged mother to contact lactation services for more assistance and praised for their efforts and dedication.   Feeding Mother's Current Feeding Choice: Breast Milk  Lactation Tools Discussed/Used Tools: Pump Breast pump type: Double-Electric Breast Pump Reason for Pumping: maternal infant separation Pumping frequency: Q3 Pumped volume: 90 mL (60-59mL per pumping session)  Interventions Interventions: Breast feeding basics reviewed;DEBP;Expressed milk;Education  Consult Status Consult Status: Follow-up Date: 07/12/20 Follow-up type: In-patient    Jolyne Laye A Higuera Ancidey 07/11/2020, 2:31 PM

## 2022-04-11 IMAGING — US US MFM OB DETAIL+14 WK
1 series · 13 of 28 positions shown · non-contrast
Comparison: none

[Series 1: us mfm ob detail+14 wk · 200 acquisitions, 13 frames shown]
[im 8/200]
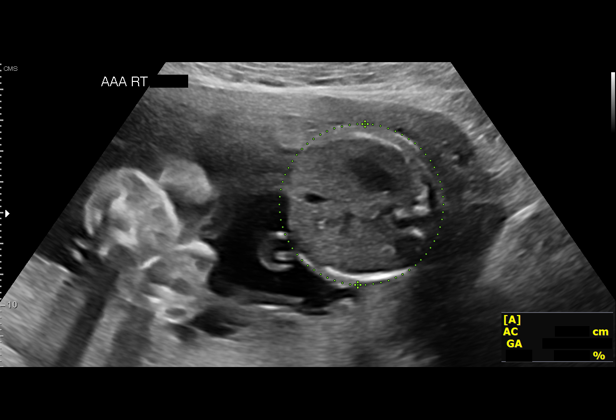
[im 23/200]
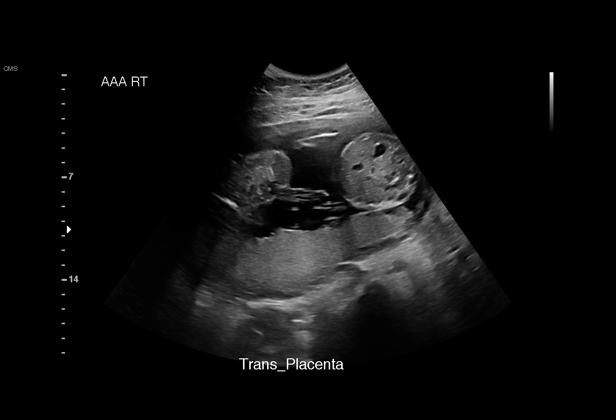
[im 37/200]
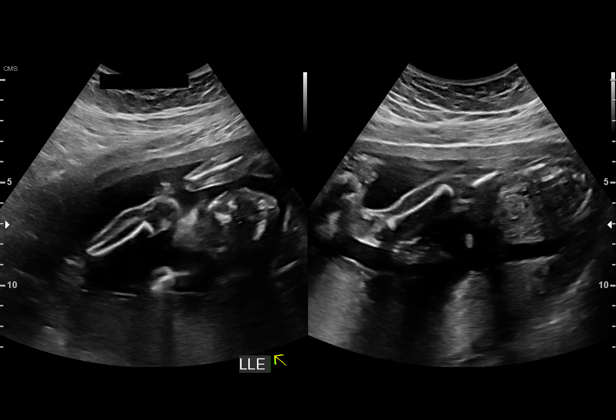
[im 52/200]
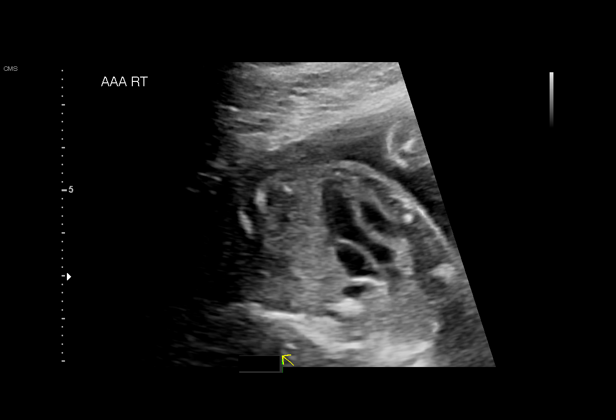
[im 67/200]
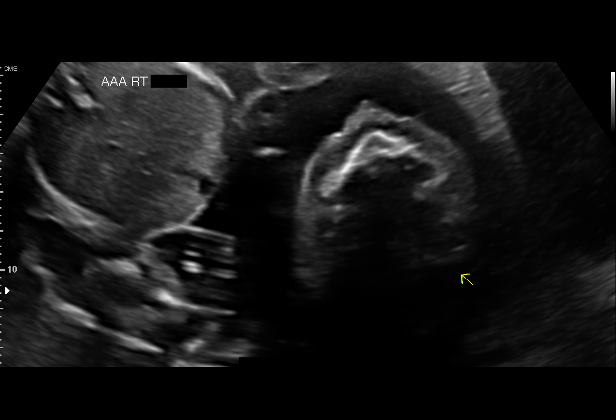
[im 82/200]
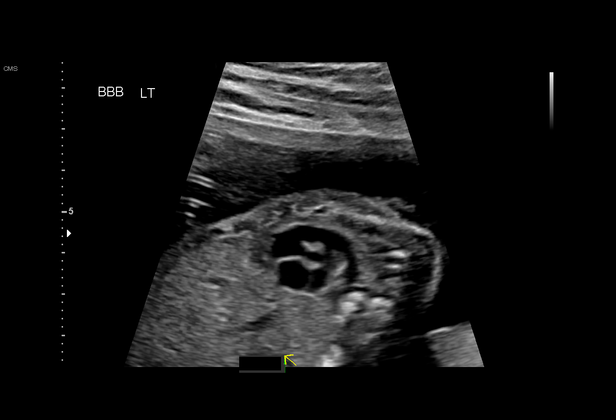
[im 104/200]
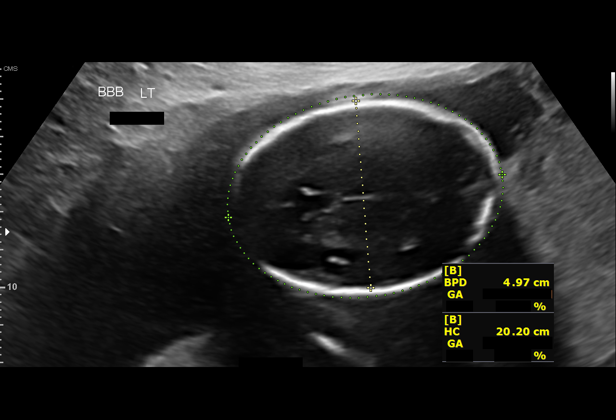
[im 118/200]
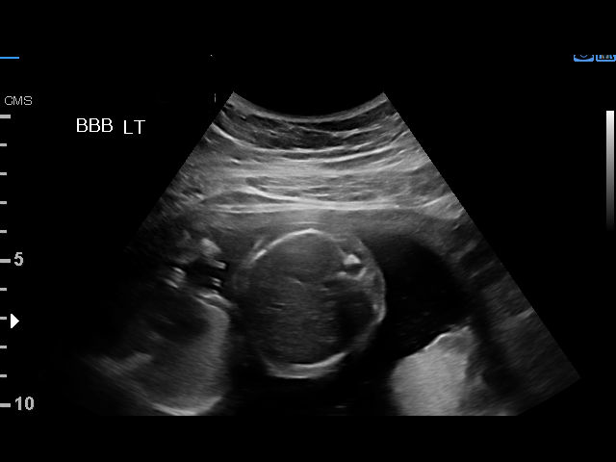
[im 133/200]
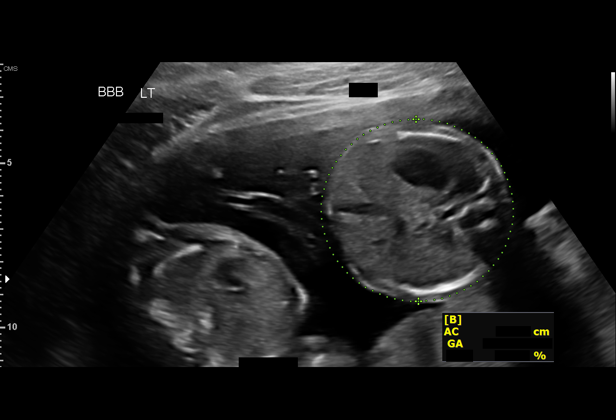
[im 148/200]
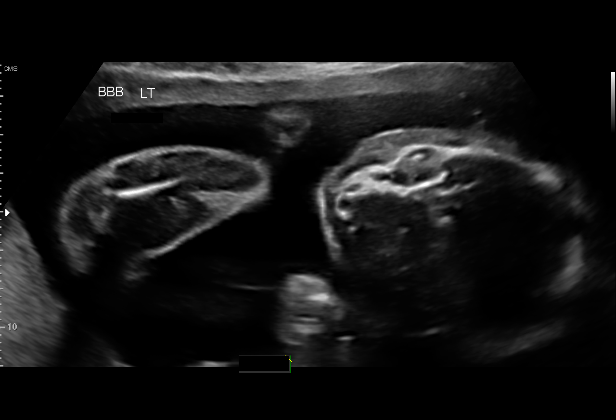
[im 163/200]
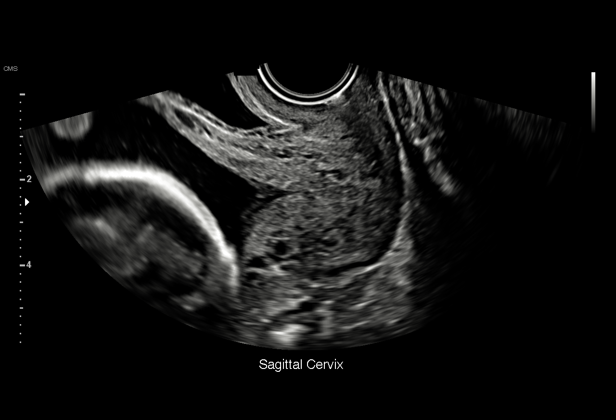
[im 177/200]
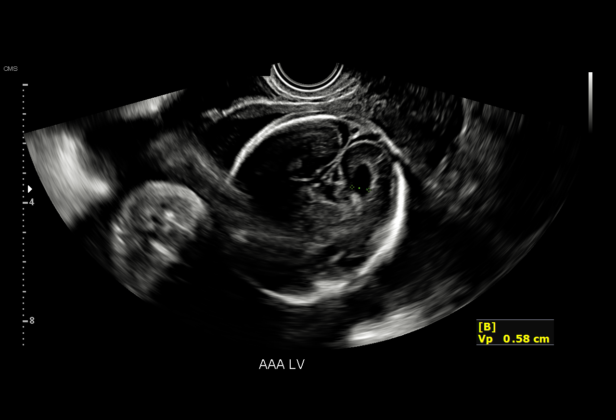
[im 192/200]
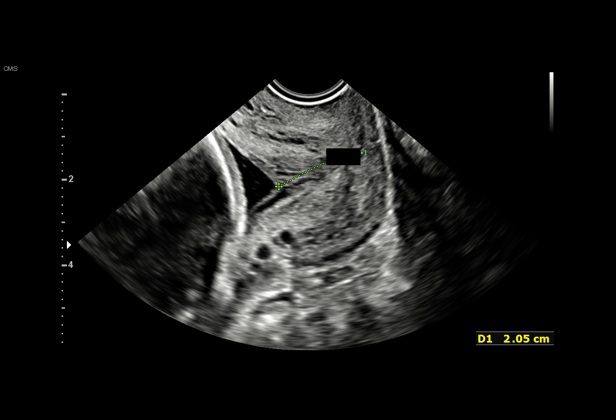

[13 of 28 positions shown; findings below may reference images not displayed]

& Infertility
                                                            7494 [REDACTED]

    +14 WK

Indications

 Twin pregnancy, Auliya/Jennell, second trimester
 Obesity complicating pregnancy, second
 trimester
 Hypertension - Chronic/Pre-existing
 Antenatal screening for malformations
 Encounter for cervical length
 Low risk NIPS, NT WNL
 21 weeks gestation of pregnancy
 Family history of genetic disorder (FOB)
Fetal Evaluation (Fetus A)

 Num Of Fetuses:         2
 Fetal Heart Rate(bpm):  150
 Cardiac Activity:       Observed
 Fetal Lie:              Lower Right Fetus
 Presentation:           Cephalic
 Placenta:               Posterior
 P. Cord Insertion:      Not well visualized
 Membrane Desc:      Dividing Membrane seen - Monochorionic
 Amniotic Fluid
 AFI FV:      Within normal limits

                             Largest Pocket(cm)

Biometry (Fetus A)

 AC:      166.8  mm     G. Age:  21w 5d         37  %
 FL:       34.9  mm     G. Age:  21w 0d         15  %    FL/AC:      20.9   %    20 - 24
 HUM:      36.5  mm     G. Age:  22w 5d         70  %
 CER:        25  mm     G. Age:  22w 6d         90  %
 LV:        3.9  mm

 Est. FW:     425  gm    0 lb 15 oz      25  %     FW Discordancy         4  %
OB History

 Gravidity:    1         Term:   0        Prem:   0        SAB:   0
 TOP:          0       Ectopic:  0        Living: 0
Gestational Age (Fetus A)

 LMP:           24w 4d        Date:  10/10/19                 EDD:   07/16/20
 U/S Today:     21w 3d                                        EDD:   08/07/20
 Best:          21w 6d     Det. By:  Early Ultrasound         EDD:   08/04/20
Anatomy (Fetus A)

 Cranium:               Appears normal         LVOT:                   Appears normal
 Cavum:                 Appears normal         Aortic Arch:            Appears normal
 Ventricles:            Appears normal         Ductal Arch:            Appears normal
 Choroid Plexus:        Appears normal         Diaphragm:              Appears normal
 Cerebellum:            Appears normal         Stomach:                Appears normal, left
                                                                       sided
 Posterior Fossa:       Appears normal         Abdomen:                Appears normal
 Nuchal Fold:           Not applicable (>20    Abdominal Wall:         Appears nml (cord
                        wks GA)                                        insert, abd wall)
 Face:                  Appears normal         Cord Vessels:           Appears normal (3
                        (orbits and profile)                           vessel cord)
 Lips:                  Appears normal         Kidneys:                Appear normal
 Palate:                Appears normal         Bladder:                Appears normal
 Thoracic:              Appears normal         Spine:                  Appears normal
 Heart:                 Appears normal         Upper Extremities:      Appears normal
                        (4CH, axis, and
                        situs)
 RVOT:                  Appears normal         Lower Extremities:      Appears normal

 Other:  Fetus appears to be a male. Nasal bone visualized. Lenses
         visualized. Heels/feet visualized.

Fetal Evaluation (Fetus B)

 Num Of Fetuses:         2
 Fetal Heart Rate(bpm):  153
 Cardiac Activity:       Observed
 Fetal Lie:              Upper Left Fetus
 Presentation:           Breech
 Placenta:               Posterior
 P. Cord Insertion:      Visualized
 Membrane Desc:      Dividing Membrane seen - Monochorionic

 Amniotic Fluid
 AFI FV:      Within normal limits

                             Largest Pocket(cm)

Biometry (Fetus B)

 BPD:        50  mm     G. Age:  21w 1d         21  %    CI:        64.98   %    70 - 86
                                                         FL/HC:      17.1   %    18.4 -
 HC:      199.3  mm     G. Age:  22w 1d         49  %    HC/AC:      1.14        1.06 -
 AC:      175.1  mm     G. Age:  22w 3d         61  %    FL/BPD:     68.2   %    71 - 87
 FL:       34.1  mm     G. Age:  20w 5d         10  %    FL/AC:      19.5   %    20 - 24
 HUM:      34.7  mm     G. Age:  21w 6d         50  %
 CER:        23  mm     G. Age:  21w 3d         53  %
 CM:        5.7  mm

 Est. FW:     442  gm           1 lb     34  %     FW Discordancy      0 \ 4 %
Gestational Age (Fetus B)

 LMP:           24w 4d        Date:  10/10/19                 EDD:   07/16/20
 U/S Today:     21w 4d                                        EDD:   08/06/20
 Best:          21w 6d     Det. By:  Early Ultrasound         EDD:   08/04/20
Anatomy (Fetus B)

 Cranium:               Appears normal         LVOT:                   Appears normal
 Cavum:                 Appears normal         Aortic Arch:            Appears normal
 Ventricles:            Appears normal         Ductal Arch:            Appears normal
 Choroid Plexus:        Appears normal         Diaphragm:              Appears normal
 Cerebellum:            Appears normal         Stomach:                Appears normal, left
                                                                       sided
 Posterior Fossa:       Appears normal         Abdomen:                Appears normal
 Nuchal Fold:           Not applicable (>20    Abdominal Wall:         Appears nml (cord
                        wks GA)                                        insert, abd wall)
 Face:                  Appears normal         Cord Vessels:           Appears normal (3
                        (orbits and profile)                           vessel cord)
 Lips:                  Appears normal         Kidneys:                Appear normal
 Palate:                Appears normal         Bladder:                Appears normal
 Thoracic:              Appears normal         Spine:                  Appears normal
 Heart:                 Appears normal         Upper Extremities:      Appears normal
                        (4CH, axis, and
                        situs)
 RVOT:                  Appears normal         Lower Extremities:      Appears normal

 Other:  Fetus appears to be a male. Nasal bone visualized. Lenses
         visualized. Left heel visualized. Technically difficult due to fetal
         position.
Cervix Uterus Adnexa

 Cervix
 Length:            2.6  cm.
 Measured transvaginally.
 Uterus
 No abnormality visualized.

 Right Ovary
 Within normal limits.

 Left Ovary
 Within normal limits.

 Cul De Sac
 No free fluid seen.

 Adnexa
 No abnormality visualized.
Comments

 This patient was seen due to a spontaneously conceived twin
 pregnancy. Her pregnancy has also been complicated by
 maternal obesity with a BMI of greater than 40.  She has also
 been followed for a shortened cervix.  Her last cervical length
 performed in your office was 2.9 cm long.  She denies any
 prior surgeries to her cervix.  This is her first pregnancy.

 She had a cell free DNA test earlier in her pregnancy which
 indicated a low risk for trisomy 21, 18, and 13.  Two male
 fetuses are predicted.  These are predicted to be
 monozygotic twins.

 A thin dividing membrane was noted separating the two
 fetuses along with a single placenta, indicating that these are
 monochorionic, diamniotic twins.

 The fetal growth and amniotic fluid level appeared
 appropriate for her gestational age for both fetuses.

 There were no obvious anomalies noted in either twin A or
 twin B.  However, the views of the fetal anatomy were
 suboptimal today due to the fetal position and maternal body
 habitus.

 The limitations of ultrasound in the detection of all anomalies
 including fetal aneuploidy was discussed with the patient
 today.

 The implications and management of monochorionic twins
 was discussed. The 10% to 15% risk of twin to twin
 transfusion syndrome seen in monochorionic, diamniotic
 twins was discussed today. She was advised that we will
 continue to follow her closely with serial ultrasounds to
 assess for signs of TTTS.
 She was advised that management of twin pregnancies will
 involve frequent ultrasound exams to assess the fetal growth
 and amniotic fluid level.  We will continue to follow her with
 ultrasounds every two weeks to assess for signs of the twin to
 twin transfusion syndrome. Weekly fetal testing should be
 started at around 32 weeks.  Delivery for uncomplicated
 monochorionic twins is recommended at around 37 weeks.
 The increased risk of preeclampsia, gestational diabetes, and
 preterm birth/labor associated with twin pregnancies was
 discussed.  She was advised that we will continue to follow
 her closely to assess for these conditions. As pregnancies
 with multiple gestations are at increased risk for developing
 preeclampsia, she was advised to start taking a daily baby
 aspirin (81 mg per day) to decrease her risk of developing
 preeclampsia  as soon as possible.
 On a transvaginal ultrasound performed today, her cervical
 length measured 2.5 to 2.6 cm long without any signs of
 funneling.  The cervix still appears to be thick.  She was
 advised that as as she does not have a prior preterm birth
 and based on the appearance of the cervix, I believe that her
 overall risk of a preterm birth is low at this time.  She does
 understand that a twin gestation will automatically increase a
 person's risk of preterm birth.We will continue to follow her
 with serial cervical length measurements.  We will consider
 starting daily vaginal progesterone should her cervical length
 continue to decrease.
 Due to the increased risk of congenital heart defects in
 monochorionic twins, she was referred to [facility]
 [facility] for a fetal echocardiogram.
 A follow-up exam was scheduled in 2 weeks.

## 2022-06-22 IMAGING — US US MFM FETAL BPP W/O NON-STRESS
1 series · 14 of 28 positions shown · non-contrast
Comparison: none

[Series 1: us mfm fetal bpp w/o non-stress · 35 acquisitions, 14 frames shown]
[im 2/35]
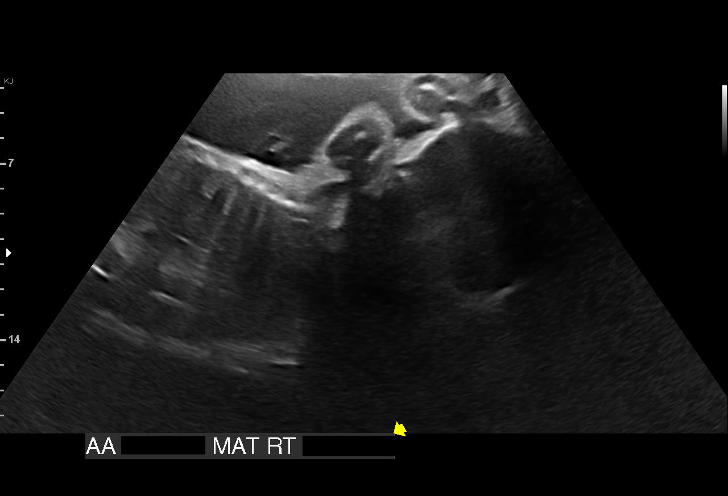
[im 4/35]
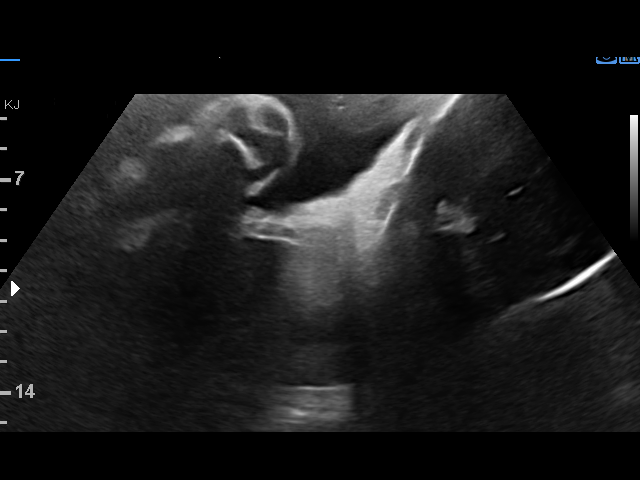
[im 7/35]
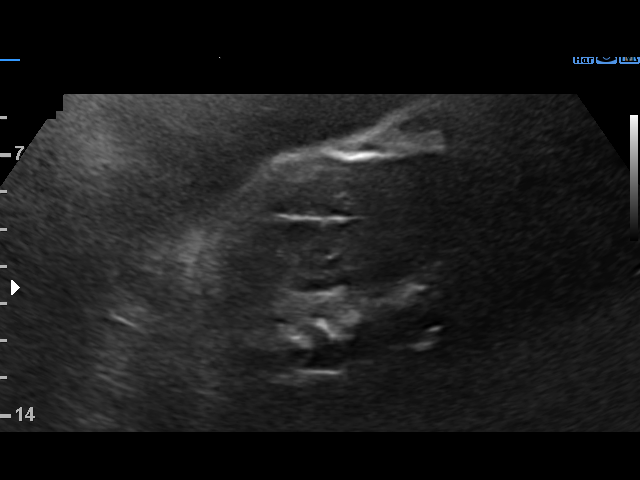
[im 9/35]
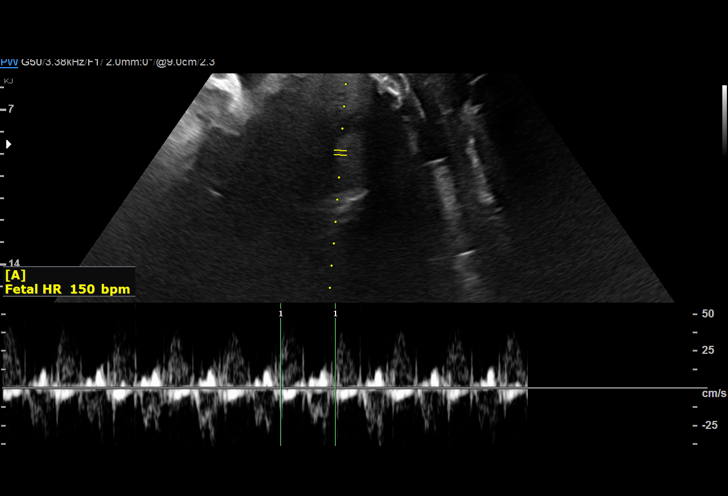
[im 12/35]
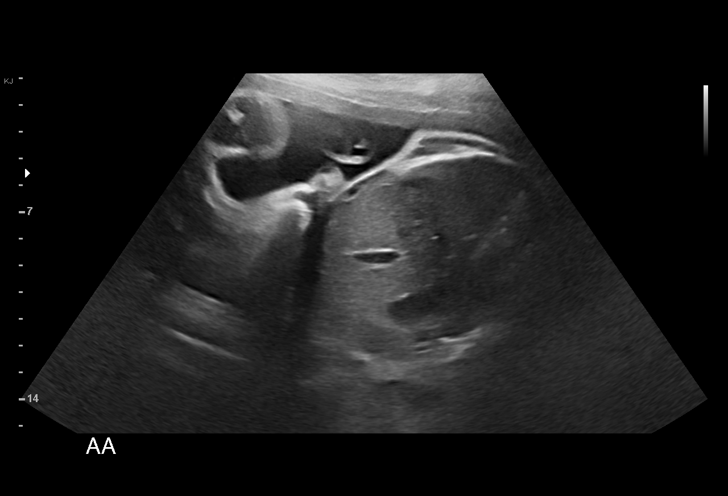
[im 14/35]
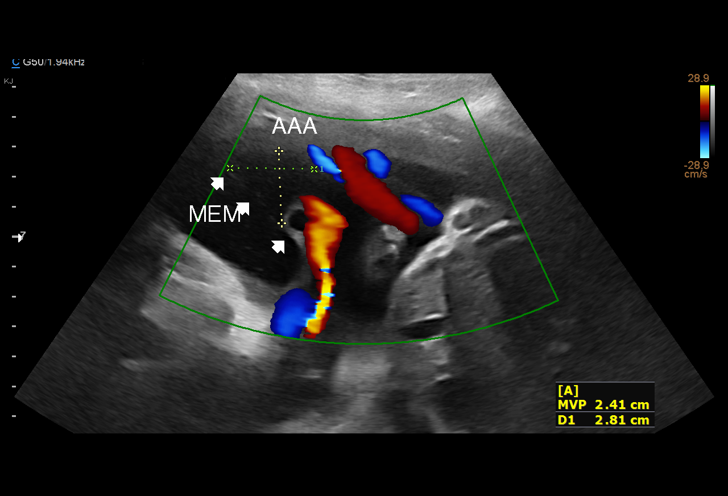
[im 17/35]
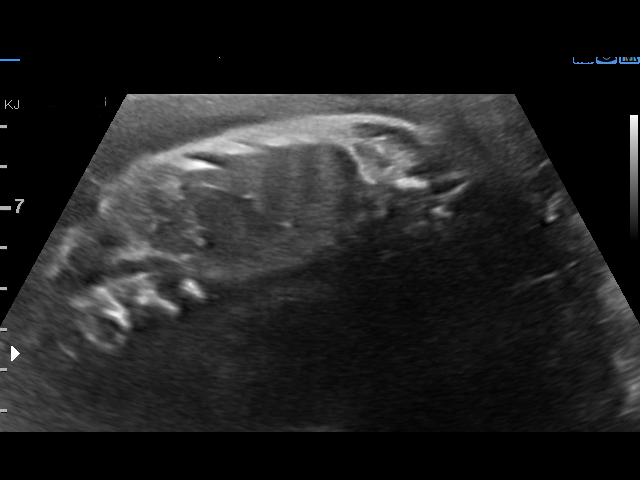
[im 19/35]
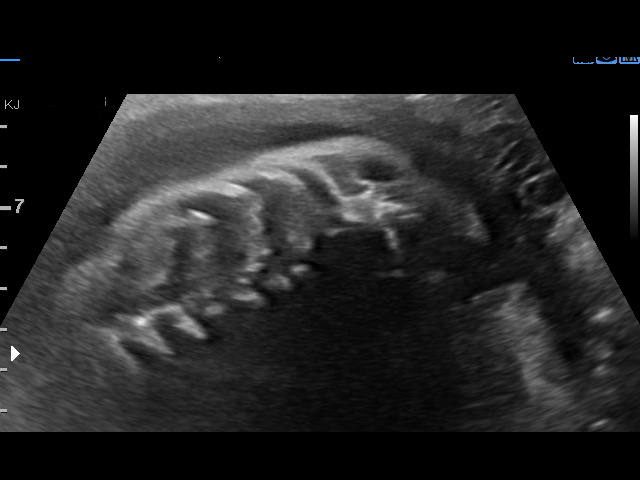
[im 22/35]
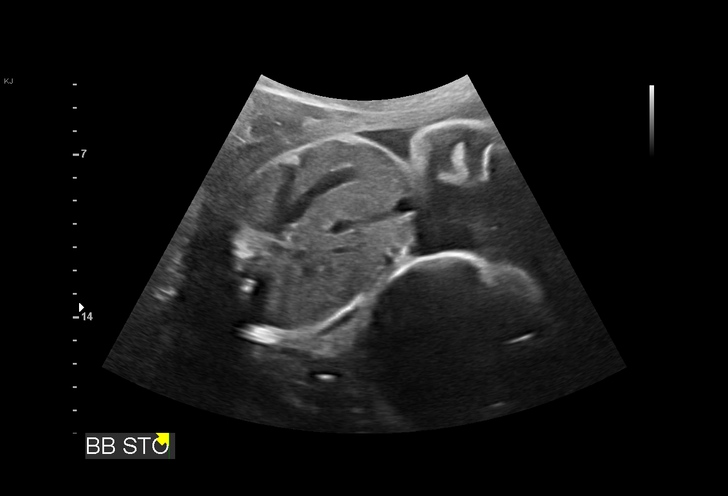
[im 24/35]
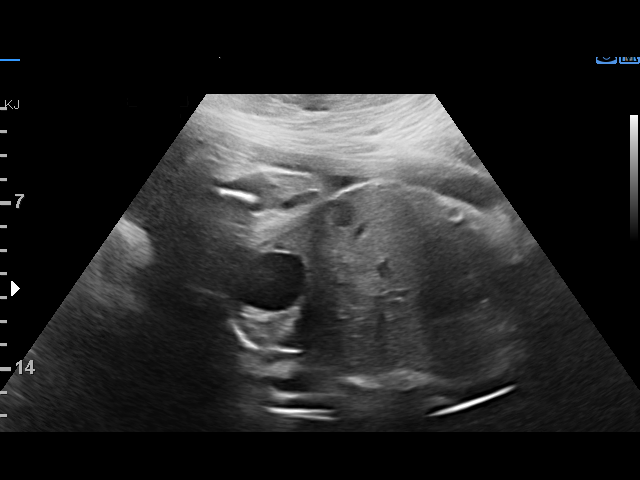
[im 27/35]
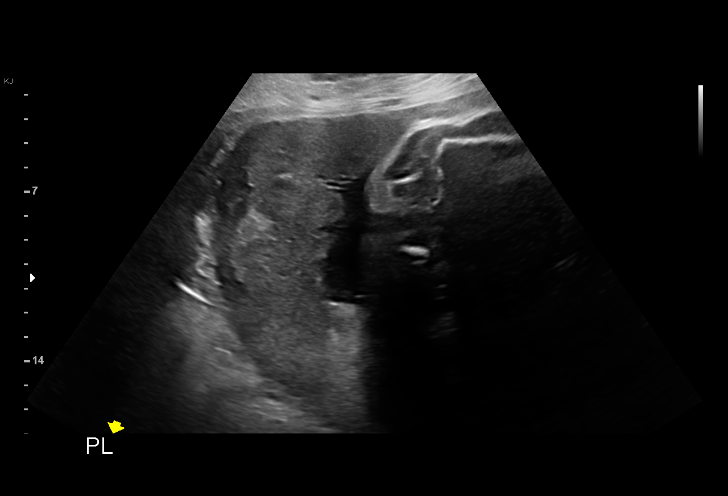
[im 29/35]
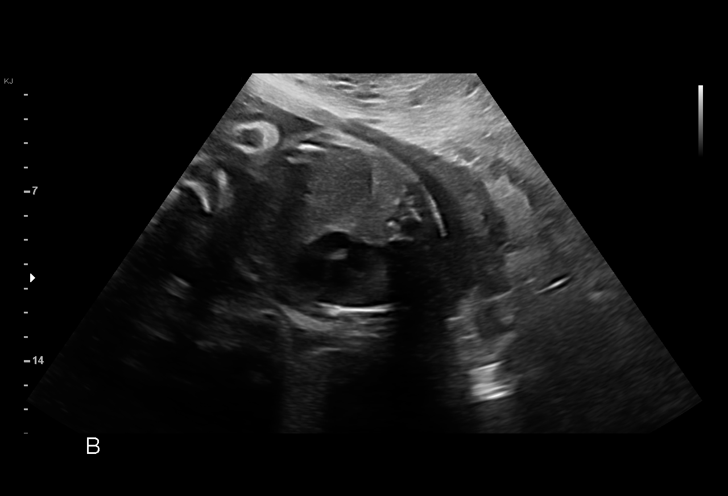
[im 32/35]
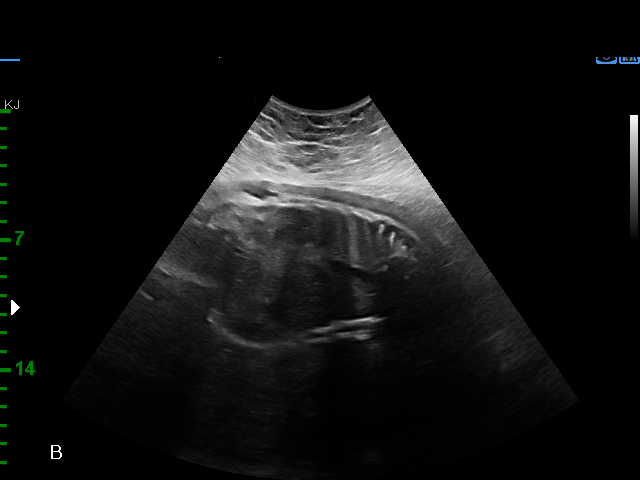
[im 35/35]
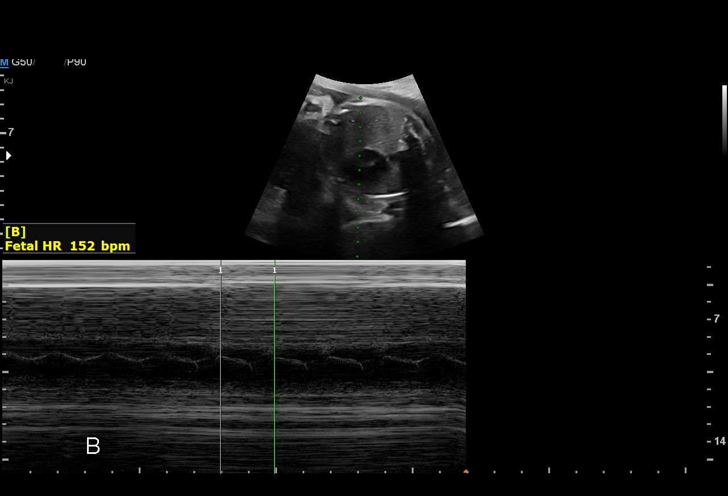

[14 of 28 positions shown; findings below may reference images not displayed]

& Infertility
                                                            6115 [REDACTED]

    ADDL GESTATION

Indications

 Twin pregnancy, Rajendra/Irisso, third trimester
 Hypertension - Chronic/Pre-existing
 Obesity complicating pregnancy, third
 trimester
 Antenatal screening for malformations
 Low risk NIPS, NT WNL
 Family history of genetic disorder (FOB)
 32 weeks gestation of pregnancy
Fetal Evaluation (Fetus A)

 Num Of Fetuses:         2
 Fetal Heart Rate(bpm):  150
 Cardiac Activity:       Observed
 Fetal Lie:              Maternal right, lower
 Presentation:           Cephalic
 Placenta:               Posterior
 P. Cord Insertion:      Not well visualized
 Membrane Desc:      Dividing Membrane seen
 Amniotic Fluid
 AFI FV:      Subjectively low-normal

                             Largest Pocket(cm)


 Comment:    0x0cm pocket seen
Biophysical Evaluation (Fetus A)

 Amniotic F.V:   Within normal limits       F. Tone:        Observed
 F. Movement:    Observed                   Score:          [DATE]
 F. Breathing:   Observed
Biometry (Fetus A)

 LV:        2.9  mm
OB History

 Gravidity:    1         Term:   0        Prem:   0        SAB:   0
 TOP:          0       Ectopic:  0        Living: 0
Gestational Age (Fetus A)

 LMP:           34w 6d        Date:  10/10/19                 EDD:   07/16/20
 Best:          32w 1d     Det. By:  Early Ultrasound         EDD:   08/04/20
Anatomy (Fetus A)

 Ventricles:            Appears normal         Bladder:                Appears normal
 Stomach:               Appears normal, left
                        sided

 Other:  All other anatomy previously seen.

Fetal Evaluation (Fetus B)

 Num Of Fetuses:         2
 Fetal Heart Rate(bpm):  152
 Cardiac Activity:       Observed
 Fetal Lie:              Maternal left, upper
 Presentation:           Cephalic
 Placenta:               Posterior
 P. Cord Insertion:      Not well visualized
 Membrane Desc:      Dividing Membrane seen

 Amniotic Fluid
 AFI FV:      Subjectively low-normal

                             Largest Pocket(cm)


 Comment:    0x0cm pocket seen
Biophysical Evaluation (Fetus B)
 Amniotic F.V:   Within normal limits       F. Tone:        Observed
 F. Movement:    Observed                   Score:          [DATE]
 F. Breathing:   Observed
Gestational Age (Fetus B)

 LMP:           34w 6d        Date:  10/10/19                 EDD:   07/16/20
 Best:          32w 1d     Det. By:  Early Ultrasound         EDD:   08/04/20
Anatomy (Fetus B)

 Stomach:               Appears normal, left   Bladder:                Appears normal
                        sided
 Kidneys:               Appear normal

 Other:  All other anatomy previously seen
Impression

 Monochorionic-diamniotic twin pregnancy.  Patient return to
 for antenatal testing.
 She had received antenatal corticosteroids at 24 weeks
 gestation.  Patient reports at your office examination, the
 cervix was 3 cm dilated.  She does not have any uterine
 contractions.
 Twin A: Lower fetus, maternal right, cephalic presentation,
 posterior placenta.  Amniotic fluid is low normal and good
 fetal activity seen.  Antenatal testing is reassuring.  BPP [DATE].

 Twin B: Upper fetus, maternal left, cephalic presentation,
 posterior placenta. Amniotic fluid is low normal and good fetal
 activity seen.  Antenatal testing is reassuring.  BPP [DATE].
 We reassured the patient of the findings.  Patient
 understands the increased likelihood of preterm labor and
 delivery.
Recommendations

 -Continue weekly BPP till delivery.
                 Ginger, Filippo

## 2023-06-20 LAB — CYTOLOGY - PAP: Pap: NEGATIVE

## 2023-10-02 LAB — OB RESULTS CONSOLE RPR: RPR: NONREACTIVE

## 2023-10-02 LAB — OB RESULTS CONSOLE ABO/RH: "RH Type ": POSITIVE

## 2023-10-02 LAB — HEPATITIS C ANTIBODY: HCV Ab: NEGATIVE

## 2023-10-02 LAB — OB RESULTS CONSOLE HEPATITIS B SURFACE ANTIGEN: Hepatitis B Surface Ag: NEGATIVE

## 2023-10-02 LAB — OB RESULTS CONSOLE HGB/HCT, BLOOD
HCT: 40 (ref 29–41)
Hemoglobin: 12.9

## 2023-10-02 LAB — OB RESULTS CONSOLE GC/CHLAMYDIA
Chlamydia: NEGATIVE
Neisseria Gonorrhea: NEGATIVE

## 2023-10-02 LAB — OB RESULTS CONSOLE PLATELET COUNT: Platelets: 272

## 2023-10-02 LAB — OB RESULTS CONSOLE HIV ANTIBODY (ROUTINE TESTING): HIV: NONREACTIVE

## 2023-10-02 LAB — OB RESULTS CONSOLE ANTIBODY SCREEN: Antibody Screen: NEGATIVE

## 2023-10-02 LAB — OB RESULTS CONSOLE RUBELLA ANTIBODY, IGM: Rubella: IMMUNE

## 2024-01-27 LAB — OB RESULTS CONSOLE RPR: RPR: NONREACTIVE

## 2024-01-27 LAB — OB RESULTS CONSOLE HIV ANTIBODY (ROUTINE TESTING): HIV: NONREACTIVE

## 2024-03-26 ENCOUNTER — Encounter (HOSPITAL_COMMUNITY): Payer: Self-pay

## 2024-03-26 ENCOUNTER — Inpatient Hospital Stay (HOSPITAL_COMMUNITY)
Admission: AD | Admit: 2024-03-26 | Discharge: 2024-03-26 | Disposition: A | Discharge status: Home / Self Care | Attending: Obstetrics and Gynecology | Admitting: Obstetrics and Gynecology

## 2024-03-26 DIAGNOSIS — Z79899 Other long term (current) drug therapy: Secondary | ICD-10-CM | POA: Insufficient documentation

## 2024-03-26 DIAGNOSIS — Z3A35 35 weeks gestation of pregnancy: Secondary | ICD-10-CM | POA: Diagnosis not present

## 2024-03-26 DIAGNOSIS — O09523 Supervision of elderly multigravida, third trimester: Secondary | ICD-10-CM | POA: Diagnosis not present

## 2024-03-26 DIAGNOSIS — O10913 Unspecified pre-existing hypertension complicating pregnancy, third trimester: Secondary | ICD-10-CM | POA: Insufficient documentation

## 2024-03-26 DIAGNOSIS — O10919 Unspecified pre-existing hypertension complicating pregnancy, unspecified trimester: Secondary | ICD-10-CM

## 2024-03-26 DIAGNOSIS — Z3689 Encounter for other specified antenatal screening: Secondary | ICD-10-CM

## 2024-03-26 DIAGNOSIS — O368330 Maternal care for abnormalities of the fetal heart rate or rhythm, third trimester, not applicable or unspecified: Secondary | ICD-10-CM | POA: Insufficient documentation

## 2024-03-26 DIAGNOSIS — Z3493 Encounter for supervision of normal pregnancy, unspecified, third trimester: Secondary | ICD-10-CM

## 2024-03-26 LAB — CBC WITH DIFFERENTIAL/PLATELET
Abs Immature Granulocytes: 0.11 K/uL — ABNORMAL HIGH (ref 0.00–0.07)
Basophils Absolute: 0 K/uL (ref 0.0–0.1)
Basophils Relative: 0 %
Eosinophils Absolute: 0.2 K/uL (ref 0.0–0.5)
Eosinophils Relative: 1 %
HCT: 37.8 % (ref 36.0–46.0)
Hemoglobin: 12.5 g/dL (ref 12.0–15.0)
Immature Granulocytes: 1 %
Lymphocytes Relative: 18 %
Lymphs Abs: 2.3 K/uL (ref 0.7–4.0)
MCH: 24.8 pg — ABNORMAL LOW (ref 26.0–34.0)
MCHC: 33.1 g/dL (ref 30.0–36.0)
MCV: 75 fL — ABNORMAL LOW (ref 80.0–100.0)
Monocytes Absolute: 0.7 K/uL (ref 0.1–1.0)
Monocytes Relative: 5 %
Neutro Abs: 9.8 K/uL — ABNORMAL HIGH (ref 1.7–7.7)
Neutrophils Relative %: 75 %
Platelets: 277 K/uL (ref 150–400)
RBC: 5.04 MIL/uL (ref 3.87–5.11)
RDW: 14.6 % (ref 11.5–15.5)
WBC: 13.1 K/uL — ABNORMAL HIGH (ref 4.0–10.5)
nRBC: 0 % (ref 0.0–0.2)

## 2024-03-26 LAB — COMPREHENSIVE METABOLIC PANEL WITH GFR
ALT: 22 U/L (ref 0–44)
AST: 17 U/L (ref 15–41)
Albumin: 3.7 g/dL (ref 3.5–5.0)
Alkaline Phosphatase: 153 U/L — ABNORMAL HIGH (ref 38–126)
Anion gap: 11 (ref 5–15)
BUN: 12 mg/dL (ref 6–20)
CO2: 22 mmol/L (ref 22–32)
Calcium: 9.6 mg/dL (ref 8.9–10.3)
Chloride: 102 mmol/L (ref 98–111)
Creatinine, Ser: 0.55 mg/dL (ref 0.44–1.00)
GFR, Estimated: >60 mL/min
Glucose, Bld: 146 mg/dL — ABNORMAL HIGH (ref 70–99)
Potassium: 3.6 mmol/L (ref 3.5–5.1)
Sodium: 135 mmol/L (ref 135–145)
Total Bilirubin: 0.4 mg/dL (ref 0.0–1.2)
Total Protein: 7.1 g/dL (ref 6.5–8.1)

## 2024-03-26 LAB — PROTEIN / CREATININE RATIO, URINE
Creatinine, Urine: 61 mg/dL
Protein Creatinine Ratio: 0.2 mg/mg — ABNORMAL HIGH
Total Protein, Urine: 12 mg/dL

## 2024-03-26 MED ORDER — NIFEDIPINE ER 30 MG PO TB24: 30.0000 mg | ORAL_TABLET | Freq: Two times a day (BID) | ORAL | 2 refills | Status: AC

## 2024-03-26 NOTE — MAU Note (Signed)
 Ashley Durham is a 39 y.o. at [redacted]w[redacted]d here in MAU reporting: Had MFM appointment. BPP 8/8 NST reactive but had 2 deceles at the end. Needs to have prolonged monitoring. Good fetal movement reported. Deneis any pain or discomfort.  LMP:  Onset of complaint: today Pain score: 0 Vitals:   03/26/24 1756  BP: 130/78  Pulse: (!) 106  Resp: 18  Temp: 98.8 F (37.1 C)     FHT: 147   Lab orders placed from triage:

## 2024-03-26 NOTE — Discharge Instructions (Addendum)
 Ms Ashley Durham,  It was a pleasure meeting you in MAU this evening. Please be aware of the signs and symptoms of preeclampsia that we discussed and return with those symptoms.  Your lab work was benign (other than PC ratio, which was pending at time of discharge).   Thank you for trusting us  to care for you, [name], Midwife

## 2024-03-26 NOTE — MAU Provider Note (Signed)
 Chief Complaint:  Non-stress Test   HPI   None     Ashley Durham is a 39 y.o. G2P0102 at Unknown who presents to maternity admissions  reporting WOB, being sent for extended monitoring due to flat tracing and a few decels at the end of tracing.  denies visual disturbances, edema, epigastric pain or headache. She additionally has elevated blood pressures, and has CHTN.   Pregnancy Course: WOB  Past Medical History:  Diagnosis Date   Alopecia    Amenorrhea    Hypertension    PCOS (polycystic ovarian syndrome)    Sleep apnea    OB History  Gravida Para Term Preterm AB Living  2 1  1  2   SAB IAB Ectopic Multiple Live Births     1 2    # Outcome Date GA Lbr Len/2nd Weight Sex Type Anes PTL Lv  2 Current           1A Preterm 06/29/20 [redacted]w[redacted]d 12:08 / 05:54 2120 g M Vag-Spont EPI  LIV     Birth Comments: Pale, molding  1B Preterm 06/29/20 [redacted]w[redacted]d 12:08 / 05:59 2260 g M Vag-Vacuum EPI  LIV   Past Surgical History:  Procedure Laterality Date   FOOT SURGERY  2008   extra bone removed from foot   WISDOM TOOTH EXTRACTION     Family History  Problem Relation Age of Onset   Arthritis Mother    Cancer Mother    Miscarriages / Stillbirths Mother    Obesity Mother    Cancer Father    Early death Father    Obesity Father    Cancer Maternal Grandfather    Stroke Maternal Grandfather    Diabetes Paternal Grandmother    Heart disease Paternal Grandmother    Obesity Paternal Grandmother    Cancer Paternal Grandfather    Obesity Paternal Grandfather    Social History[1] Allergies[2] Medications Prior to Admission  Medication Sig Dispense Refill Last Dose/Taking   cholecalciferol (VITAMIN D3) 25 MCG (1000 UNIT) tablet Take 1,000 Units by mouth daily.   03/26/2024   Prenatal Vit-Fe Fumarate-FA (PRENATAL MULTIVITAMIN) TABS tablet Take 1 tablet by mouth daily at 12 noon.   03/25/2024   [DISCONTINUED] NIFEdipine  (ADALAT  CC) 30 MG 24 hr tablet Take 1 tablet (30 mg total) by mouth  daily. (Patient taking differently: Take 30 mg by mouth 2 (two) times daily.) 30 tablet 1 03/26/2024   Ascorbic Acid (VITAMIN C) 1000 MG tablet Take 1,000 mg by mouth daily.      benzocaine -Menthol  (DERMOPLAST) 20-0.5 % AERO Apply 1 application topically as needed for irritation (perineal discomfort).      coconut oil OIL Apply 1 application topically as needed.  0    hydrochlorothiazide  (MICROZIDE ) 12.5 MG capsule Take 1 capsule (12.5 mg total) by mouth daily for 5 days. 5 capsule 0    ibuprofen  (ADVIL ) 600 MG tablet Take 1 tablet (600 mg total) by mouth every 8 (eight) hours as needed. 30 tablet 0    senna-docusate (SENOKOT-S) 8.6-50 MG tablet Take 2 tablets by mouth daily.       I have reviewed patient's Past Medical Hx, Surgical Hx, Family Hx, Social Hx, medications and allergies.   ROS  Pertinent items noted in HPI and remainder of comprehensive ROS otherwise negative.   PHYSICAL EXAM  Patient Vitals for the past 24 hrs:  BP Temp Pulse Resp SpO2 Height Weight  03/26/24 1852 (!) 148/54 -- 91 -- -- -- --  03/26/24 1842 ROLLEN)  152/68 -- 98 -- -- -- --  03/26/24 1833 (!) 140/61 -- 96 -- -- -- --  03/26/24 1815 -- -- -- -- 97 % -- --  03/26/24 1807 (!) 146/63 -- 100 -- -- -- --  03/26/24 1756 130/78 98.8 F (37.1 C) (!) 106 18 -- 5' 4 (1.626 m) (!) 149.2 kg    Physical Exam Vitals and nursing note reviewed.  Constitutional:      General: She is not in acute distress.    Appearance: Normal appearance. She is obese. She is not ill-appearing, toxic-appearing or diaphoretic.  HENT:     Head: Normocephalic.  Cardiovascular:     Rate and Rhythm: Normal rate.     Pulses: Normal pulses.  Pulmonary:     Effort: Pulmonary effort is normal.  Abdominal:     Comments: Gravid  Skin:    General: Skin is warm and dry.     Capillary Refill: Capillary refill takes less than 2 seconds.  Neurological:     General: No focal deficit present.     Mental Status: She is alert and oriented to person,  place, and time.  Psychiatric:        Mood and Affect: Mood normal.        Behavior: Behavior normal.        Thought Content: Thought content normal.        Judgment: Judgment normal.         Fetal Tracing: Baseline: 145 Variability:moderate Accelerations: 15x15 Decelerations:absent Toco: quiet   Labs: Results for orders placed or performed during the hospital encounter of 03/26/24 (from the past 24 hours)  CBC with Differential/Platelet     Status: Abnormal   Collection Time: 03/26/24  5:39 PM  Result Value Ref Range   WBC 13.1 (H) 4.0 - 10.5 K/uL   RBC 5.04 3.87 - 5.11 MIL/uL   Hemoglobin 12.5 12.0 - 15.0 g/dL   HCT 62.1 63.9 - 53.9 %   MCV 75.0 (L) 80.0 - 100.0 fL   MCH 24.8 (L) 26.0 - 34.0 pg   MCHC 33.1 30.0 - 36.0 g/dL   RDW 85.3 88.4 - 84.4 %   Platelets 277 150 - 400 K/uL   nRBC 0.0 0.0 - 0.2 %   Neutrophils Relative % 75 %   Neutro Abs 9.8 (H) 1.7 - 7.7 K/uL   Lymphocytes Relative 18 %   Lymphs Abs 2.3 0.7 - 4.0 K/uL   Monocytes Relative 5 %   Monocytes Absolute 0.7 0.1 - 1.0 K/uL   Eosinophils Relative 1 %   Eosinophils Absolute 0.2 0.0 - 0.5 K/uL   Basophils Relative 0 %   Basophils Absolute 0.0 0.0 - 0.1 K/uL   Immature Granulocytes 1 %   Abs Immature Granulocytes 0.11 (H) 0.00 - 0.07 K/uL  Comprehensive metabolic panel     Status: Abnormal   Collection Time: 03/26/24  5:39 PM  Result Value Ref Range   Sodium 135 135 - 145 mmol/L   Potassium 3.6 3.5 - 5.1 mmol/L   Chloride 102 98 - 111 mmol/L   CO2 22 22 - 32 mmol/L   Glucose, Bld 146 (H) 70 - 99 mg/dL   BUN 12 6 - 20 mg/dL   Creatinine, Ser 9.44 0.44 - 1.00 mg/dL   Calcium  9.6 8.9 - 10.3 mg/dL   Total Protein 7.1 6.5 - 8.1 g/dL   Albumin 3.7 3.5 - 5.0 g/dL   AST 17 15 - 41 U/L   ALT 22 0 -  44 U/L   Alkaline Phosphatase 153 (H) 38 - 126 U/L   Total Bilirubin 0.4 0.0 - 1.2 mg/dL   GFR, Estimated >39 >39 mL/min   Anion gap 11 5 - 15  Protein / creatinine ratio, urine     Status: Abnormal    Collection Time: 03/26/24  6:30 PM  Result Value Ref Range   Creatinine, Urine 61 mg/dL   Total Protein, Urine 12 mg/dL   Protein Creatinine Ratio 0.2 (H) <0.2 mg/mg    Imaging:  No results found.  MDM & MAU COURSE  MDM:  Moderate - Evaluate for PEC, CBC and CMP benign. PCR elevated at 0.2 but non-diagnostic.  - NST reactive, reassuring for 1 hour monitoring.    MAU Course: Orders Placed This Encounter  Procedures   CBC with Differential/Platelet   Comprehensive metabolic panel   Protein / creatinine ratio, urine   Discharge patient   Meds ordered this encounter  Medications   NIFEdipine  (ADALAT  CC) 30 MG 24 hr tablet    Sig: Take 1 tablet (30 mg total) by mouth 2 (two) times daily.    Dispense:  60 tablet    Refill:  2    Supervising Provider:   CONSTANT, PEGGY [[id number]]    ASSESSMENT   1. Chronic hypertension affecting pregnancy   2. [redacted] weeks gestation of pregnancy   3. NST (non-stress test) reactive   4. Movement of fetus present during pregnancy in third trimester     PLAN  Discharge home in stable condition.  - PEC labs benign, mild range blood pressures with CHTN diagnosis. - Has follow up appointment 04/01/24.  - Able to verbalize s/s of PEC and reasons to return to MAU.    Follow-up Information     [facility] Follow up on 04/01/2024.   Contact information: [address] [place] [address] [phone]                 Allergies as of 03/26/2024   No Known Allergies      Medication List     STOP taking these medications    hydrochlorothiazide  12.5 MG capsule Commonly known as: MICROZIDE    ibuprofen  600 MG tablet Commonly known as: ADVIL        TAKE these medications    benzocaine -Menthol  20-0.5 % Aero Commonly known as: DERMOPLAST Apply 1 application topically as needed for irritation (perineal discomfort).   cholecalciferol 25 MCG (1000 UNIT) tablet Commonly known as: VITAMIN D3 Take 1,000 Units by mouth daily.    coconut oil Oil Apply 1 application topically as needed.   NIFEdipine  30 MG 24 hr tablet Commonly known as: ADALAT  CC Take 1 tablet (30 mg total) by mouth 2 (two) times daily.   prenatal multivitamin Tabs tablet Take 1 tablet by mouth daily at 12 noon.   senna-docusate 8.6-50 MG tablet Commonly known as: Senokot-S Take 2 tablets by mouth daily.   vitamin C 1000 MG tablet Take 1,000 mg by mouth daily.        [name] Rote, MSN, CNM 03/26/2024 8:54 PM  Certified Nurse Midwife,  Medical Group         [1]  Social History Tobacco Use   Smoking status: Never   Smokeless tobacco: Never  Vaping Use   Vaping status: Never Used  Substance Use Topics   Alcohol use: Not Currently    Comment: 1-2 times per month    Drug use: No  [2] No Known Allergies

## 2024-04-01 ENCOUNTER — Ambulatory Visit

## 2024-04-01 ENCOUNTER — Encounter: Payer: Self-pay | Admitting: Certified Nurse Midwife

## 2024-04-01 ENCOUNTER — Other Ambulatory Visit: Payer: Self-pay

## 2024-04-01 ENCOUNTER — Ambulatory Visit: Payer: Self-pay | Admitting: Certified Nurse Midwife

## 2024-04-01 VITALS — BP 130/85 | HR 98 | Wt 330.3 lb

## 2024-04-01 DIAGNOSIS — O10913 Unspecified pre-existing hypertension complicating pregnancy, third trimester: Secondary | ICD-10-CM | POA: Diagnosis not present

## 2024-04-01 DIAGNOSIS — Z8632 Personal history of gestational diabetes: Secondary | ICD-10-CM

## 2024-04-01 DIAGNOSIS — O0993 Supervision of high risk pregnancy, unspecified, third trimester: Secondary | ICD-10-CM

## 2024-04-01 DIAGNOSIS — O0933 Supervision of pregnancy with insufficient antenatal care, third trimester: Secondary | ICD-10-CM

## 2024-04-01 DIAGNOSIS — Z3A36 36 weeks gestation of pregnancy: Secondary | ICD-10-CM

## 2024-04-01 DIAGNOSIS — O0990 Supervision of high risk pregnancy, unspecified, unspecified trimester: Secondary | ICD-10-CM | POA: Insufficient documentation

## 2024-04-01 DIAGNOSIS — E282 Polycystic ovarian syndrome: Secondary | ICD-10-CM | POA: Insufficient documentation

## 2024-04-01 DIAGNOSIS — O10919 Unspecified pre-existing hypertension complicating pregnancy, unspecified trimester: Secondary | ICD-10-CM

## 2024-04-01 DIAGNOSIS — O321XX0 Maternal care for breech presentation, not applicable or unspecified: Secondary | ICD-10-CM

## 2024-04-01 NOTE — Progress Notes (Signed)
 "  History:   Ashley Durham is a 39 y.o. G2P0102 at [redacted]w[redacted]d by LMP being seen today for her first obstetrical visit.  Her obstetrical history is significant for advanced maternal age, BMI, a history of gestational diabetes, chronic hypertension, and one previous twin pregnancy with preterm vaginal delivery of 34w boys. Patient does intend to breast feed. Pregnancy history fully reviewed.  Patient reports no complaints. Was receiving care at [facility] but this baby was found to be breech. Their practice does not offer breech vaginal deliveries so she switched care over to [facility]. Is seeing a chiropractor twice weekly to facilitate cephalic presentation.     HISTORY: OB History  Gravida Para Term Preterm AB Living  2 1 0 1 0 2  SAB IAB Ectopic Multiple Live Births  0 0 0 1 2    # Outcome Date GA Lbr Len/2nd Weight Sex Type Anes PTL Lv  2 Current           1A Preterm 06/29/20 [redacted]w[redacted]d 12:08 / 05:54 4 lb 10.8 oz (2.12 kg) M Vag-Spont EPI  LIV     Birth Comments: Pale, molding     Name: CORONA CAL,BOYA Ashley Durham     Apgar1: 7  Apgar5: 8  1B Preterm 06/29/20 [redacted]w[redacted]d 12:08 / 05:59 4 lb 15.7 oz (2.26 kg) M Vag-Vacuum EPI  LIV     Name: CORONA CAL,BOYB Ashley Durham     Apgar1: 8  Apgar5: 9   Past Medical History:  Diagnosis Date   Alopecia    Amenorrhea    Anxiety 01/09/2019   Never formally dx'd. Saw therapist in past, but not in past several years.   Aware that [facility] services available to her.     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   Hypertension    PCOS (polycystic ovarian syndrome)    Sleep apnea    Steatohepatitis 11/26/2019   Past Surgical History:  Procedure Laterality Date   FOOT SURGERY  2008   extra bone removed from foot   WISDOM TOOTH EXTRACTION     Family History  Problem Relation Age of Onset   Arthritis Mother    Cancer Mother    Miscarriages / Stillbirths Mother    Obesity Mother    Cancer Father    Early death Father    Obesity Father    Cancer Maternal Grandfather     Stroke Maternal Grandfather    Diabetes Paternal Grandmother    Heart disease Paternal Grandmother    Obesity Paternal Grandmother    Cancer Paternal Grandfather    Obesity Paternal Grandfather    Social History[1] Allergies[2] Medications Ordered Prior to Encounter[3]  Review of Systems Pertinent items noted in HPI and remainder of comprehensive ROS otherwise negative. Physical Exam:   Vitals:   04/01/24 0921  BP: 130/85  Pulse: 98  Weight: (!) 330 lb 4.8 oz (149.8 kg)   Fetal Heart Rate (bpm): 150 FH: 36  Constitutional: Well-developed, well-nourished pregnant female in no acute distress.  HEENT: PERRLA Skin: normal color and turgor, no rash Cardiovascular: normal rate & rhythm, warm and well perfused Respiratory: normal effort, no problems with respiration noted GI: Abd soft, non-distended MS: Extremities nontender, no edema, normal ROM Neurologic: Alert and oriented x 4.  GU: no CVA tenderness Pelvic: Exam deferred  Assessment:    Pregnancy: [id number] Patient Active Problem List   Diagnosis Date Noted   PCOS (polycystic ovarian syndrome) 04/01/2024   Supervision of high-risk pregnancy 04/01/2024   History of diet controlled gestational  diabetes mellitus (GDM) 06/30/2020   Chronic hypertension affecting pregnancy 06/23/2020   History of preterm labor 05/19/2020   OSA (obstructive sleep apnea) 11/01/2019     Plan:   1. Supervision of high risk pregnancy in third trimester (Primary) - Doing well, feeling regular and vigorous fetal movement   2. [redacted] weeks gestation of pregnancy - Routine OB care, had GBS testing at last visit with [facility], was negative  3. History of diet controlled gestational diabetes mellitus (GDM) - 1hr test last pregnancy was off by 1-2pts, diagnosed by midwife - Wore CGM in this pregnancy, values reviewed by CNM. Had 2-3 values that were 150-160 within first two hours after eating but always returned in range - Was told to increase  protein, now eats 100g protein daily and has noted she's more likely to get hypoglycemic if she does not eat frequent small meals  4. Chronic hypertension affecting pregnancy - Stable on 30mg  nifedipine  XL BID. Tried 60mg  daily but it made her feel bad so previous CNM switched her to BID which works well - US  Fetal BPP W/O Non Stress; Future - US  Fetal BPP W/O Non Stress; Future - US  Fetal BPP W/O Non Stress; Future - US  OB Follow Up; Future  5. Breech presentation, single or unspecified fetus - Confirmed with bedside ultrasound, head central with back to maternal left - Open to ECV at 39 weeks with subsequent IOL, will get in with Dr. Fredirick as soon as possible  6. Initial obstetric visit in third trimester - Pregnancy testing reviewed - Continue prenatal vitamins. - Problem list reviewed and updated. - Genetic Screening discussed, First trimester screen, Quad screen, and NIPS: results reviewed. - Ultrasound discussed; fetal anatomic survey: results reviewed. - Anticipatory guidance about prenatal visits given including labs, ultrasounds, and testing. - Discussed usage of Babyscripts and virtual visits as additional source of managing and completing prenatal visits in midst of coronavirus and pandemic.   - Encouraged to complete MyChart Registration for her ability to review results, send requests, and have questions addressed.  - The nature of [place] [facility] Practice with multiple MDs and Advanced Practice Providers was explained to patient; also emphasized that residents, students are part of our team. - Routine obstetric precautions reviewed. Encouraged to seek out care at office or emergency room [facility] MAU preferred) for urgent and/or emergent concerns.  Return in about 1 week (around 04/08/2024) for IN-PERSON, HOB.    Future Appointments  Date Time Provider Department Center  04/08/2024  8:55 AM [facility] US2 [facility]  04/08/2024  9:35 AM [facility]  US2 [facility]  04/08/2024 10:15 AM Vannie Cornell JONELLE EDDY [facility]  04/15/2024  9:35 AM [facility] US2 [facility]  04/15/2024 10:15 AM Fredirick Glenys RAMAN, MD [facility]    Cornell Vannie, MSN, CNM, IBCLC Certified Nurse Midwife, [facility]      [1]  Social History Tobacco Use   Smoking status: Never   Smokeless tobacco: Never  Vaping Use   Vaping status: Never Used  Substance Use Topics   Alcohol use: Not Currently    Comment: 1-2 times per month    Drug use: No  [2] No Known Allergies [3]  Current Outpatient Medications on File Prior to Visit  Medication Sig Dispense Refill   BABY ASPIRIN PO Take 81 mg by mouth daily.     magnesium oxide (MAG-OX) 400 MG tablet Take 200 mg by mouth.     NIFEdipine  (ADALAT  CC)  30 MG 24 hr tablet Take 1 tablet (30 mg total) by mouth 2 (two) times daily. 60 tablet 2   Omega-3 Fatty Acids (FISH OIL) 1000 MG CAPS Take 1 g by mouth.     Prenatal Vit-Fe Fumarate-FA (PRENATAL MULTIVITAMIN) TABS tablet Take 1 tablet by mouth daily at 12 noon.     VITAMIN D PO Take 5,000 Units by mouth daily.     No current facility-administered medications on file prior to visit.   "

## 2024-04-08 ENCOUNTER — Ambulatory Visit

## 2024-04-08 ENCOUNTER — Other Ambulatory Visit: Payer: Self-pay

## 2024-04-08 ENCOUNTER — Ambulatory Visit: Payer: Self-pay | Admitting: Certified Nurse Midwife

## 2024-04-08 VITALS — BP 124/81 | HR 93 | Wt 332.5 lb

## 2024-04-08 DIAGNOSIS — O10919 Unspecified pre-existing hypertension complicating pregnancy, unspecified trimester: Secondary | ICD-10-CM

## 2024-04-08 DIAGNOSIS — Z3A37 37 weeks gestation of pregnancy: Secondary | ICD-10-CM

## 2024-04-08 DIAGNOSIS — O10913 Unspecified pre-existing hypertension complicating pregnancy, third trimester: Secondary | ICD-10-CM

## 2024-04-08 DIAGNOSIS — R519 Headache, unspecified: Secondary | ICD-10-CM

## 2024-04-08 DIAGNOSIS — O0993 Supervision of high risk pregnancy, unspecified, third trimester: Secondary | ICD-10-CM

## 2024-04-08 DIAGNOSIS — O321XX0 Maternal care for breech presentation, not applicable or unspecified: Secondary | ICD-10-CM | POA: Diagnosis not present

## 2024-04-10 ENCOUNTER — Encounter: Payer: Self-pay | Admitting: Certified Nurse Midwife

## 2024-04-15 ENCOUNTER — Ambulatory Visit: Payer: Self-pay | Admitting: Family Medicine

## 2024-04-15 ENCOUNTER — Other Ambulatory Visit: Payer: Self-pay

## 2024-04-15 ENCOUNTER — Encounter: Payer: Self-pay | Admitting: Certified Nurse Midwife

## 2024-04-15 ENCOUNTER — Encounter: Payer: Self-pay | Admitting: General Practice

## 2024-04-15 ENCOUNTER — Ambulatory Visit (INDEPENDENT_AMBULATORY_CARE_PROVIDER_SITE_OTHER)

## 2024-04-15 VITALS — BP 140/83 | HR 96 | Wt 332.0 lb

## 2024-04-15 DIAGNOSIS — O10913 Unspecified pre-existing hypertension complicating pregnancy, third trimester: Secondary | ICD-10-CM | POA: Diagnosis not present

## 2024-04-15 DIAGNOSIS — O0993 Supervision of high risk pregnancy, unspecified, third trimester: Secondary | ICD-10-CM

## 2024-04-15 DIAGNOSIS — G4733 Obstructive sleep apnea (adult) (pediatric): Secondary | ICD-10-CM

## 2024-04-15 DIAGNOSIS — Z8632 Personal history of gestational diabetes: Secondary | ICD-10-CM | POA: Diagnosis not present

## 2024-04-15 DIAGNOSIS — Z3A38 38 weeks gestation of pregnancy: Secondary | ICD-10-CM

## 2024-04-15 DIAGNOSIS — O10919 Unspecified pre-existing hypertension complicating pregnancy, unspecified trimester: Secondary | ICD-10-CM

## 2024-04-15 NOTE — Progress Notes (Signed)
" ° °  PRENATAL VISIT NOTE  Subjective:  Ashley Durham is a 39 y.o. G2P0102 at [redacted]w[redacted]d being seen today for ongoing prenatal care.  She is currently monitored for the following issues for this high-risk pregnancy and has History of preterm labor; Chronic hypertension affecting pregnancy; History of diet controlled gestational diabetes mellitus (GDM); PCOS (polycystic ovarian syndrome); OSA (obstructive sleep apnea); and Supervision of high-risk pregnancy on their problem list.  Patient reports no complaints.  Contractions: Irritability. Vag. Bleeding: None.  Movement: Present. Denies leaking of fluid.   The following portions of the patient's history were reviewed and updated as appropriate: allergies, current medications, past family history, past medical history, past social history, past surgical history and problem list.   Objective:   Vitals:   04/15/24 1034 04/15/24 1046  BP: (!) 142/95 (!) 140/83  Pulse: 96   Weight: (!) 332 lb (150.6 kg)     Fetal Status:  Fetal Heart Rate (bpm): 155   Movement: Present    General: Alert, oriented and cooperative. Patient is in no acute distress.  Skin: Skin is warm and dry. No rash noted.   Cardiovascular: Normal heart rate noted  Respiratory: Normal respiratory effort, no problems with respiration noted  Abdomen: Soft, gravid, appropriate for gestational age.  Pain/Pressure: Present     Pelvic: Cervical exam deferred        Extremities: Normal range of motion.  Edema: Trace  Mental Status: Normal mood and affect. Normal behavior. Normal judgment and thought content.      04/01/2024   11:15 AM  Depression screen PHQ 2/9  Decreased Interest 0  Down, Depressed, Hopeless 0  PHQ - 2 Score 0  Altered sleeping 2  Tired, decreased energy 2  Change in appetite 0  Feeling bad or failure about yourself  0  Trouble concentrating 0  Moving slowly or fidgety/restless 0  Suicidal thoughts 0  PHQ-9 Score 4        04/01/2024   11:16 AM  GAD 7  : Generalized Anxiety Score  Nervous, Anxious, on Edge 0  Control/stop worrying 0  Worry too much - different things 0  Trouble relaxing 1  Restless 0  Easily annoyed or irritable 1  Afraid - awful might happen 1  Total GAD 7 Score 3    Assessment and Plan:  Pregnancy: G2P0102 at [redacted]w[redacted]d 1. Chronic hypertension affecting pregnancy (Primary) BP is up On Procardia    Labs today, may need to increase meds if stays pregnant.  Recommend delivery. - CBC - Protein / creatinine ratio, urine - Comprehensive metabolic panel with GFR  2. OSA (obstructive sleep apnea) On CPAP  3. Supervision of high risk pregnancy in third trimester Continue prenatal care. Breech - discussed ECV, discussed difficulty in knowing if this would be successful as well as details about the procedure. Could book for Saturday.  4. History of diet controlled gestational diabetes mellitus (GDM) Wore CGM this pregnancy with reported normal CBGs.  5. [redacted] weeks gestation of pregnancy   Term labor symptoms and general obstetric precautions including but not limited to vaginal bleeding, contractions, leaking of fluid and fetal movement were reviewed in detail with the patient. Please refer to After Visit Summary for other counseling recommendations.   Return in 1 week (on 04/22/2024).  Future Appointments  Date Time Provider Department Center  04/22/2024 10:15 AM [facility]1 [facility]    Glenys GORMAN Birk, MD   "

## 2024-04-15 NOTE — Progress Notes (Signed)
 Reports some sinus headaches over the past week- has improved with Mucinex. No dizziness/blurry vision.

## 2024-04-16 ENCOUNTER — Ambulatory Visit: Payer: Self-pay | Admitting: Family Medicine

## 2024-04-16 LAB — COMPREHENSIVE METABOLIC PANEL WITH GFR
ALT: 16 [IU]/L (ref 0–32)
AST: 14 [IU]/L (ref 0–40)
Albumin: 3.5 g/dL — ABNORMAL LOW (ref 3.9–4.9)
Alkaline Phosphatase: 172 [IU]/L — ABNORMAL HIGH (ref 41–116)
BUN/Creatinine Ratio: 22 (ref 9–23)
BUN: 11 mg/dL (ref 6–20)
Bilirubin Total: 0.4 mg/dL (ref 0.0–1.2)
CO2: 21 mmol/L (ref 20–29)
Calcium: 9.5 mg/dL (ref 8.7–10.2)
Chloride: 102 mmol/L (ref 96–106)
Creatinine, Ser: 0.5 mg/dL — ABNORMAL LOW (ref 0.57–1.00)
Globulin, Total: 2.8 g/dL (ref 1.5–4.5)
Glucose: 105 mg/dL — ABNORMAL HIGH (ref 70–99)
Potassium: 3.9 mmol/L (ref 3.5–5.2)
Sodium: 136 mmol/L (ref 134–144)
Total Protein: 6.3 g/dL (ref 6.0–8.5)
eGFR: 124 mL/min/{1.73_m2}

## 2024-04-16 LAB — CBC
Hematocrit: 40.5 % (ref 34.0–46.6)
Hemoglobin: 12.5 g/dL (ref 11.1–15.9)
MCH: 23.9 pg — ABNORMAL LOW (ref 26.6–33.0)
MCHC: 30.9 g/dL — ABNORMAL LOW (ref 31.5–35.7)
MCV: 78 fL — ABNORMAL LOW (ref 79–97)
Platelets: 289 10*3/uL (ref 150–450)
RBC: 5.22 x10E6/uL (ref 3.77–5.28)
RDW: 14.6 % (ref 11.7–15.4)
WBC: 12.1 10*3/uL — ABNORMAL HIGH (ref 3.4–10.8)

## 2024-04-21 ENCOUNTER — Encounter: Payer: Self-pay | Admitting: Obstetrics and Gynecology

## 2024-04-22 ENCOUNTER — Other Ambulatory Visit
